# Patient Record
Sex: Female | Born: 1935 | Hispanic: Yes | Smoking: Never smoker
Health system: Southern US, Community
[De-identification: ages and names within clinical notes are randomized; demographics above are authoritative.]

## PROBLEM LIST (undated history)

## (undated) DIAGNOSIS — F419 Anxiety disorder, unspecified: Secondary | ICD-10-CM

## (undated) DIAGNOSIS — H409 Unspecified glaucoma: Secondary | ICD-10-CM

## (undated) DIAGNOSIS — I5022 Chronic systolic (congestive) heart failure: Secondary | ICD-10-CM

## (undated) DIAGNOSIS — I10 Essential (primary) hypertension: Secondary | ICD-10-CM

## (undated) DIAGNOSIS — Z9289 Personal history of other medical treatment: Secondary | ICD-10-CM

---

## 2020-08-09 ENCOUNTER — Other Ambulatory Visit: Payer: Self-pay

## 2020-08-09 ENCOUNTER — Ambulatory Visit
Admission: EM | Admit: 2020-08-09 | Discharge: 2020-08-09 | Disposition: A | Discharge status: Home / Self Care | Payer: Medicare Other | Attending: Family Medicine | Admitting: Family Medicine

## 2020-08-09 ENCOUNTER — Ambulatory Visit: Payer: Self-pay

## 2020-08-09 ENCOUNTER — Ambulatory Visit (INDEPENDENT_AMBULATORY_CARE_PROVIDER_SITE_OTHER): Payer: Medicare Other

## 2020-08-09 DIAGNOSIS — K449 Diaphragmatic hernia without obstruction or gangrene: Secondary | ICD-10-CM | POA: Diagnosis not present

## 2020-08-09 DIAGNOSIS — R059 Cough, unspecified: Secondary | ICD-10-CM | POA: Diagnosis not present

## 2020-08-09 MED ORDER — BENZONATATE 200 MG PO CAPS: 200.0000 mg | ORAL_CAPSULE | Freq: Three times a day (TID) | ORAL | 0 refills | Status: DC | PRN

## 2020-08-09 MED ORDER — PREDNISONE 20 MG PO TABS: 40.0000 mg | ORAL_TABLET | Freq: Every day | ORAL | 0 refills | Status: AC

## 2020-08-09 NOTE — ED Provider Notes (Signed)
[facility]    CSN: [id number] Arrival date & time: 08/09/20  1144      History   Chief Complaint Chief Complaint  Patient presents with  . Cough   HPI  87 year old female presents with cough.  3-week history of cough.  It is productive.  Denies shortness of breath.  No PND orthopnea.  She has a history of heart failure.  No documented fever.  Patient is concerned given the lack of improvement.  No reported sick contacts.  No other complaints.  PMH: CHF, systolic  Home Medications    Prior to Admission medications   Medication Sig Start Date End Date Taking? Authorizing Provider  benzonatate (TESSALON) 200 MG capsule Take 1 capsule (200 mg total) by mouth 3 (three) times daily as needed for cough. 08/09/20  Yes Mohid Furuya G, DO  carvedilol (COREG) 6.25 MG tablet Take 6.25 mg by mouth 2 (two) times daily with a meal.   Yes [provider]  furosemide (LASIX) 20 MG tablet Take 20 mg by mouth.   Yes [provider]  predniSONE (DELTASONE) 20 MG tablet Take 2 tablets (40 mg total) by mouth daily for 5 days. 08/09/20 08/14/20 Yes Royce Stegman G, DO  sacubitril-valsartan (ENTRESTO) 49-51 MG Take 1 tablet by mouth 2 (two) times daily.   Yes [provider]   Social History Social History   Tobacco Use  . Smoking status: Never Smoker  . Smokeless tobacco: Never Used  Vaping Use  . Vaping Use: Never used  Substance Use Topics  . Alcohol use: Yes    Comment: rare  . Drug use: Never   Allergies   Aspirin, Iodine, Mercury, and Tylenol [acetaminophen]   Review of Systems Review of Systems  Constitutional: Negative for fever.  Respiratory: Positive for cough.    Physical Exam Triage Vital Signs ED Triage Vitals  Enc Vitals Group     BP 08/09/20 1213 (!) 186/102     Pulse Rate 08/09/20 1213 70     Resp 08/09/20 1213 18     Temp 08/09/20 1213 97.9 F (36.6 C)     Temp Source 08/09/20 1213 Oral     SpO2 08/09/20 1213 95 %     Weight  08/09/20 1208 188 lb (85.3 kg)     Height 08/09/20 1208 5\' 4"  (1.626 m)     Head Circumference --      Peak Flow --      Pain Score 08/09/20 1208 0     Pain Loc --      Pain Edu? --      Excl. in GC? --    Updated Vital Signs BP (!) 186/102 (BP Location: Left Arm)   Pulse 70   Temp 97.9 F (36.6 C) (Oral)   Resp 18   Ht 5\' 4"  (1.626 m)   Wt 85.3 kg   SpO2 95%   BMI 32.27 kg/m   Visual Acuity Right Eye Distance:   Left Eye Distance:   Bilateral Distance:    Right Eye Near:   Left Eye Near:    Bilateral Near:     Physical Exam Vitals and nursing note reviewed.  Constitutional:      General: She is not in acute distress.    Appearance: Normal appearance. She is not ill-appearing.  HENT:     Head: Normocephalic and atraumatic.  Eyes:     General:        Right eye: No discharge.  Left eye: No discharge.     Conjunctiva/sclera: Conjunctivae normal.  Cardiovascular:     Rate and Rhythm: Normal rate and regular rhythm.  Pulmonary:     Effort: Pulmonary effort is normal.     Breath sounds: Wheezing present. No rales.  Neurological:     Mental Status: She is alert.  Psychiatric:        Mood and Affect: Mood normal.        Behavior: Behavior normal.    UC Treatments / Results  Labs (all labs ordered are listed, but only abnormal results are displayed) Labs Reviewed - No data to display  EKG   Radiology DG Chest 2 View  Result Date: 08/09/2020 CLINICAL DATA:  Cough EXAM: CHEST - 2 VIEW COMPARISON:  None. FINDINGS: Cardiac shadow is enlarged. Large hiatal hernia is noted. Scattered calcified lymph nodes are noted consistent with prior granulomatous disease. Mild blunting of the right costophrenic angle is noted laterally. No sizable effusion is seen posteriorly. This may be related to chronic scarring. No other focal infiltrate is noted. IMPRESSION: Large hiatal hernia. Changes of prior granulomatous disease. Blunting of the right costophrenic angle laterally  without posterior effusion. This may be related to underlying scarring. Electronically Signed   By: Alcide Clever M.D.   On: 08/09/2020 13:15    Procedures Procedures (including critical care time)  Medications Ordered in UC Medications - No data to display  Initial Impression / Assessment and Plan / UC Course  I have reviewed the triage vital signs and the nursing notes.  Pertinent labs & imaging results that were available during my care of the patient were reviewed by me and considered in my medical decision making (see chart for details).    87 year old female presents with cough.  Chest x-ray was obtained.  Patient has several areas of calcified lymph nodes consistent with prior granulomatous disease.  Scarring of the right base.  No acute findings.  Given wheezing on exam, I am placing her on prednisone.  Tessalon Perles for cough.  Final Clinical Impressions(s) / UC Diagnoses   Final diagnoses:  Cough     Discharge Instructions     Medication as prescribed.  If you worsen, please let us know.  Take care  Dr. Adriana Simas    ED Prescriptions    Medication Sig Dispense Auth. Provider   predniSONE (DELTASONE) 20 MG tablet Take 2 tablets (40 mg total) by mouth daily for 5 days. 10 tablet Johnnay Pleitez G, DO   benzonatate (TESSALON) 200 MG capsule Take 1 capsule (200 mg total) by mouth 3 (three) times daily as needed for cough. 30 capsule Tommie Sams, DO     PDMP not reviewed this encounter.   Tommie Sams, [place] 08/09/20 [phone]

## 2020-08-09 NOTE — ED Triage Notes (Signed)
Patient states that she has been having a cough, nasal congestion x 3 weeks. States that this started after being around a cat that she is allergic to. Patient states that her cough is very productive.

## 2020-08-09 NOTE — Discharge Instructions (Signed)
Medication as prescribed.  If you worsen, please let us know.  Take care  Dr. Kiyon Fidalgo  

## 2021-03-23 ENCOUNTER — Ambulatory Visit
Admission: RE | Admit: 2021-03-23 | Discharge: 2021-03-23 | Disposition: A | Discharge status: Home / Self Care | Payer: Medicare Other | Source: Ambulatory Visit | Attending: Physician Assistant | Admitting: Physician Assistant

## 2021-03-23 ENCOUNTER — Other Ambulatory Visit: Payer: Self-pay

## 2021-03-23 VITALS — BP 160/90 | HR 81 | Temp 98.0°F | Resp 18 | Ht 64.0 in | Wt 192.0 lb

## 2021-03-23 DIAGNOSIS — I10 Essential (primary) hypertension: Secondary | ICD-10-CM | POA: Diagnosis not present

## 2021-03-23 DIAGNOSIS — Z20822 Contact with and (suspected) exposure to covid-19: Secondary | ICD-10-CM | POA: Diagnosis not present

## 2021-03-23 DIAGNOSIS — R051 Acute cough: Secondary | ICD-10-CM | POA: Diagnosis present

## 2021-03-23 DIAGNOSIS — Z886 Allergy status to analgesic agent status: Secondary | ICD-10-CM | POA: Insufficient documentation

## 2021-03-23 DIAGNOSIS — B349 Viral infection, unspecified: Secondary | ICD-10-CM | POA: Diagnosis not present

## 2021-03-23 DIAGNOSIS — Z7901 Long term (current) use of anticoagulants: Secondary | ICD-10-CM | POA: Insufficient documentation

## 2021-03-23 DIAGNOSIS — Z888 Allergy status to other drugs, medicaments and biological substances status: Secondary | ICD-10-CM | POA: Diagnosis not present

## 2021-03-23 DIAGNOSIS — R0981 Nasal congestion: Secondary | ICD-10-CM | POA: Insufficient documentation

## 2021-03-23 NOTE — ED Provider Notes (Signed)
[facility]    CSN: [id number] Arrival date & time: 03/23/21  1250      History   Chief Complaint Chief Complaint  Patient presents with   Cough    HPI Diana Garrett is a 87 y.o. female presenting for 5-day history of cough that is occasionally productive.  Cough is worse when she lays down at night.  She did have a scratchy throat and nasal congestion at onset but those symptoms have resolved.  She has not had any fevers, chest pain or breathing difficulty.  Her daughter is ill with similar symptoms.  No known COVID exposure.  Has been taking over-the-counter decongestants without improvement in symptoms.  States they raise her blood pressure.  History of hypertension and takes Coreg.  No history of asthma or COPD.  No other complaints.  HPI  History reviewed. No pertinent past medical history.  There are no problems to display for this patient.   Past Surgical History:  Procedure Laterality Date   CESAREAN SECTION      OB History   No obstetric history on file.      Home Medications    Prior to Admission medications   Medication Sig Start Date End Date Taking? Authorizing Provider  carvedilol (COREG) 6.25 MG tablet Take 6.25 mg by mouth 2 (two) times daily with a meal.   Yes [provider]  furosemide (LASIX) 20 MG tablet Take 20 mg by mouth.   Yes [provider]  sacubitril-valsartan (ENTRESTO) 49-51 MG Take 1 tablet by mouth 2 (two) times daily.   Yes [provider]    Family History History reviewed. No pertinent family history.  Social History Social History   Tobacco Use   Smoking status: Never   Smokeless tobacco: Never  Vaping Use   Vaping Use: Never used  Substance Use Topics   Alcohol use: Yes    Comment: rare   Drug use: Never     Allergies   Aspirin, Iodine, Mercury, and Tylenol [acetaminophen]   Review of Systems Review of Systems  Constitutional:  Negative for chills, diaphoresis, fatigue  and fever.  HENT:  Positive for congestion. Negative for ear pain, rhinorrhea, sinus pressure, sinus pain and sore throat.   Respiratory:  Positive for cough. Negative for shortness of breath.   Cardiovascular:  Negative for chest pain.  Gastrointestinal:  Negative for abdominal pain, nausea and vomiting.  Musculoskeletal:  Negative for arthralgias and myalgias.  Skin:  Negative for rash.  Neurological:  Negative for weakness and headaches.  Hematological:  Negative for adenopathy.    Physical Exam Triage Vital Signs ED Triage Vitals  Enc Vitals Group     BP 03/23/21 1333 (!) 160/90     Pulse Rate 03/23/21 1333 81     Resp 03/23/21 1333 18     Temp 03/23/21 1333 98 F (36.7 C)     Temp Source 03/23/21 1333 Oral     SpO2 03/23/21 1333 97 %     Weight 03/23/21 1331 192 lb (87.1 kg)     Height 03/23/21 1331 5\' 4"  (1.626 m)     Head Circumference --      Peak Flow --      Pain Score --      Pain Loc --      Pain Edu? --      Excl. in GC? --    No data found.  Updated Vital Signs BP (!) 160/90 (BP Location: Left Arm) Comment: Pt  gets white coat  Pulse 81   Temp 98 F (36.7 C) (Oral)   Resp 18   Ht 5\' 4"  (1.626 m)   Wt 192 lb (87.1 kg)   SpO2 97%   BMI 32.96 kg/m      Physical Exam Vitals and nursing note reviewed.  Constitutional:      General: She is not in acute distress.    Appearance: Normal appearance. She is not ill-appearing or toxic-appearing.  HENT:     Head: Normocephalic and atraumatic.     Nose: Congestion present.     Mouth/Throat:     Mouth: Mucous membranes are moist.     Pharynx: Oropharynx is clear.  Eyes:     General: No scleral icterus.       Right eye: No discharge.        Left eye: No discharge.     Conjunctiva/sclera: Conjunctivae normal.  Cardiovascular:     Rate and Rhythm: Normal rate and regular rhythm.     Heart sounds: Normal heart sounds.  Pulmonary:     Effort: Pulmonary effort is normal. No respiratory distress.     Breath  sounds: Normal breath sounds.  Musculoskeletal:     Cervical back: Neck supple.  Skin:    General: Skin is dry.  Neurological:     General: No focal deficit present.     Mental Status: She is alert. Mental status is at baseline.     Motor: No weakness.     Gait: Gait normal.  Psychiatric:        Mood and Affect: Mood normal.        Behavior: Behavior normal.        Thought Content: Thought content normal.     UC Treatments / Results  Labs (all labs ordered are listed, but only abnormal results are displayed) Labs Reviewed  SARS CORONAVIRUS 2 (TAT 6-24 HRS)    EKG   Radiology No results found.  Procedures Procedures (including critical care time)  Medications Ordered in UC Medications - No data to display  Initial Impression / Assessment and Plan / UC Course  I have reviewed the triage vital signs and the nursing notes.  Pertinent labs & imaging results that were available during my care of the patient were reviewed by me and considered in my medical decision making (see chart for details).  87 year old female presenting for cough x5 days.  Cough occasionally is productive.  Nasal congestion and sore throat have resolved.  No fever or breathing difficulty.  Blood pressure elevated 160/90.  She is afebrile and oxygen is normal.  Exam reveals minor nasal congestion.  Chest clear to auscultation heart regular rate and rhythm.  PCR COVID test obtained.  Current CDC guidelines, isolation protocol and ED precautions reviewed.  Advised symptoms consistent with viral illness.  Supportive care encouraged with increasing rest and fluids.  Advised over-the-counter Coricidin HBP and to follow-up for fever, chest pain, worsening cough or breathing issue.  Final Clinical Impressions(s) / UC Diagnoses   Final diagnoses:  Viral illness  Acute cough     Discharge Instructions      -The cough is likely due to a virus.  We will run its course over 7 to 10 days.  If you develop a  fever, wheezing or breathing difficulty, return centimeters.   -Purchase and start over-the-counter Coricidin HBP for cough.  This will not raise your blood pressure. -COVID test will be back tomorrow.  If you are COVID-positive  EF to be isolated 5 days from when the symptoms started and then wear a mask for 5 days.     ED Prescriptions   None    PDMP not reviewed this encounter.   Shirlee Latch, PA-C 03/23/21 1440

## 2021-03-23 NOTE — ED Triage Notes (Signed)
Pt here with C/O cough for 5 days, worst at night when she lays down, has tried cough drops and some OTC cough medication without relief.

## 2021-03-23 NOTE — Discharge Instructions (Addendum)
-  The cough is likely due to a virus.  We will run its course over 7 to 10 days.  If you develop a fever, wheezing or breathing difficulty, return centimeters.   -Purchase and start over-the-counter Coricidin HBP for cough.  This will not raise your blood pressure. -COVID test will be back tomorrow.  If you are COVID-positive EF to be isolated 5 days from when the symptoms started and then wear a mask for 5 days.

## 2021-03-24 LAB — SARS CORONAVIRUS 2 (TAT 6-24 HRS): SARS Coronavirus 2: NEGATIVE

## 2021-06-07 ENCOUNTER — Other Ambulatory Visit: Payer: Self-pay

## 2021-06-07 ENCOUNTER — Ambulatory Visit (INDEPENDENT_AMBULATORY_CARE_PROVIDER_SITE_OTHER): Payer: Medicare Other

## 2021-06-07 ENCOUNTER — Ambulatory Visit
Admission: EM | Admit: 2021-06-07 | Discharge: 2021-06-07 | Disposition: A | Discharge status: Home / Self Care | Payer: Medicare Other | Attending: Emergency Medicine | Admitting: Emergency Medicine

## 2021-06-07 DIAGNOSIS — R059 Cough, unspecified: Secondary | ICD-10-CM

## 2021-06-07 DIAGNOSIS — J189 Pneumonia, unspecified organism: Secondary | ICD-10-CM

## 2021-06-07 MED ORDER — AEROCHAMBER MV MISC: 2 refills | Status: DC

## 2021-06-07 MED ORDER — AZITHROMYCIN 250 MG PO TABS: 250.0000 mg | ORAL_TABLET | Freq: Every day | ORAL | 0 refills | Status: DC

## 2021-06-07 MED ORDER — ALBUTEROL SULFATE HFA 108 (90 BASE) MCG/ACT IN AERS: 2.0000 | INHALATION_SPRAY | RESPIRATORY_TRACT | 0 refills | Status: AC | PRN

## 2021-06-07 MED ORDER — AMOXICILLIN-POT CLAVULANATE 875-125 MG PO TABS: 1.0000 | ORAL_TABLET | Freq: Two times a day (BID) | ORAL | 0 refills | Status: DC

## 2021-06-07 MED ORDER — PREDNISONE 50 MG PO TABS: ORAL_TABLET | ORAL | 0 refills | Status: DC

## 2021-06-07 NOTE — Discharge Instructions (Addendum)
Take the Augmentin twice daily with food for 7 days for treatment of your pneumonia.  Take the azithromycin as directed.  You will take 2 tablets on day 1 and then 1 tablet each day after that for total of 5 days for treatment of your pneumonia.  Started in the morning take the prednisone 50 mg tablet each morning with breakfast for 5 days to decrease lung inflammation.  Use the albuterol inhaler with a spacer, 2 puffs every 4-6 hours, as needed for shortness of breath or wheezing.  Use the Tessalon Perles every 8 hours during the day as needed for cough.  Taken with a small sip of water.  These may give you numbness to the base of your tongue or metallic taste in mouth, this is normal  Return for reevaluation if you have any new or worsening symptoms.  Follow-up with your primary care provider in 4 to 6 weeks for a repeat chest x-ray to ensure resolution of your pneumonia.

## 2021-06-07 NOTE — ED Triage Notes (Signed)
Pt presents with complaints of cough x 2 months. Reports ongoing feeling sick x 1 weak. Today patient feels like her blood pressure is low.

## 2021-06-07 NOTE — ED Provider Notes (Signed)
[facility]    CSN: [id number] Arrival date & time: 06/07/21  1757      History   Chief Complaint Chief Complaint  Patient presents with   Cough    HPI Diana Garrett is a 87 y.o. female.   HPI  87 year old female here for evaluation of respiratory complaints.  Patient is here with her son for evaluation of a cough that is been present for last 2 months.  The cough she reports is productive for a white sputum and she also endorses intermittent wheezing.  She has not had any fever, runny nose nasal congestion, sore throat, or GI complaints.  Patient does report that she has felt sick for the past week.  She thought initially that her blood pressure might be low.  History reviewed. No pertinent past medical history.  There are no problems to display for this patient.   Past Surgical History:  Procedure Laterality Date   CESAREAN SECTION      OB History   No obstetric history on file.      Home Medications    Prior to Admission medications   Medication Sig Start Date End Date Taking? Authorizing Provider  albuterol (VENTOLIN HFA) 108 (90 Base) MCG/ACT inhaler Inhale 2 puffs into the lungs every 4 (four) hours as needed. 06/07/21  Yes Becky Augusta, NP  amoxicillin-clavulanate (AUGMENTIN) 875-125 MG tablet Take 1 tablet by mouth every 12 (twelve) hours. 06/07/21  Yes Becky Augusta, NP  azithromycin (ZITHROMAX Z-PAK) 250 MG tablet Take 1 tablet (250 mg total) by mouth daily. Take 2 tablets on the first day and then 1 tablet daily thereafter for a total of 5 days of treatment. 06/07/21  Yes Becky Augusta, NP  predniSONE (DELTASONE) 50 MG tablet Take 1 tablet daily by mouth for 5 days. 06/07/21  Yes Becky Augusta, NP  Spacer/Aero-Holding Deretha Emory (AEROCHAMBER MV) inhaler Use as instructed 06/07/21  Yes Becky Augusta, NP  carvedilol (COREG) 6.25 MG tablet Take 6.25 mg by mouth 2 (two) times daily with a meal.    [provider]  furosemide (LASIX) 20 MG  tablet Take 20 mg by mouth.    [provider]  sacubitril-valsartan (ENTRESTO) 49-51 MG Take 1 tablet by mouth 2 (two) times daily.    [provider]    Family History History reviewed. No pertinent family history.  Social History Social History   Tobacco Use   Smoking status: Never   Smokeless tobacco: Never  Vaping Use   Vaping Use: Never used  Substance Use Topics   Alcohol use: Yes    Comment: rare   Drug use: Never     Allergies   Aspirin, Iodine, Mercury, and Tylenol [acetaminophen]   Review of Systems Review of Systems  Constitutional:  Negative for activity change, appetite change and fever.  HENT:  Negative for congestion, ear pain, rhinorrhea and sore throat.   Respiratory:  Positive for cough and wheezing. Negative for shortness of breath.   Gastrointestinal:  Negative for diarrhea, nausea and vomiting.  Skin:  Negative for rash.  Hematological: Negative.   Psychiatric/Behavioral: Negative.      Physical Exam Triage Vital Signs ED Triage Vitals  Enc Vitals Group     BP 06/07/21 1807 (!) 166/100     Pulse Rate 06/07/21 1806 (!) 107     Resp 06/07/21 1806 19     Temp 06/07/21 1806 98.1 F (36.7 C)     Temp src --      SpO2  06/07/21 1806 96 %     Weight --      Height --      Head Circumference --      Peak Flow --      Pain Score 06/07/21 1806 0     Pain Loc --      Pain Edu? --      Excl. in GC? --    No data found.  Updated Vital Signs BP (!) 166/100    Pulse (!) 107    Temp 98.1 F (36.7 C)    Resp 19    SpO2 96%   Visual Acuity Right Eye Distance:   Left Eye Distance:   Bilateral Distance:    Right Eye Near:   Left Eye Near:    Bilateral Near:     Physical Exam Vitals and nursing note reviewed.  Constitutional:      General: She is not in acute distress.    Appearance: She is ill-appearing.  HENT:     Head: Normocephalic and atraumatic.     Right Ear: Tympanic membrane, ear canal and external ear normal.  There is no impacted cerumen.     Left Ear: Tympanic membrane, ear canal and external ear normal. There is no impacted cerumen.     Nose: Nose normal. No congestion or rhinorrhea.     Mouth/Throat:     Mouth: Mucous membranes are moist.     Pharynx: No posterior oropharyngeal erythema.  Cardiovascular:     Rate and Rhythm: Normal rate and regular rhythm.     Pulses: Normal pulses.     Heart sounds: Normal heart sounds. No murmur heard.   No gallop.  Pulmonary:     Effort: Pulmonary effort is normal.  Musculoskeletal:     Cervical back: Normal range of motion and neck supple.  Lymphadenopathy:     Cervical: No cervical adenopathy.  Skin:    General: Skin is warm and dry.     Capillary Refill: Capillary refill takes less than 2 seconds.     Findings: No erythema or rash.  Neurological:     General: No focal deficit present.     Mental Status: She is alert and oriented to person, place, and time.  Psychiatric:        Mood and Affect: Mood normal.        Behavior: Behavior normal.        Thought Content: Thought content normal.        Judgment: Judgment normal.     UC Treatments / Results  Labs (all labs ordered are listed, but only abnormal results are displayed) Labs Reviewed - No data to display  EKG   Radiology DG Chest 2 View  Result Date: 06/07/2021 CLINICAL DATA:  Cough for 2 months EXAM: CHEST - 2 VIEW COMPARISON:  08/09/2020 FINDINGS: Frontal and lateral views of the chest are obtained. Patient is rotated to the right on the frontal projection. A large hiatal hernia is again identified. Cardiac silhouette is stable. There is patchy consolidation in the right lung base, which could reflect airspace disease or compressive atelectasis. Blunting of the right lateral costophrenic angle unchanged, consistent with pleural thickening. No evidence of effusion or pneumothorax. IMPRESSION: 1. Patchy right basilar consolidation, which may reflect atelectasis or developing  pneumonia. 2. Large hiatal hernia. 3. Stable pleural thickening at the right costophrenic angle. Electronically Signed   By: Sharlet Salina M.D.   On: 06/07/2021 18:34    Procedures Procedures (including critical  care time)  Medications Ordered in UC Medications - No data to display  Initial Impression / Assessment and Plan / UC Course  I have reviewed the triage vital signs and the nursing notes.  Pertinent labs & imaging results that were available during my care of the patient were reviewed by me and considered in my medical decision making (see chart for details).  Patient is a ill-appearing 87 year old female here for evaluation of a cough and wheezing that been present for the last 2 months.  Patient reports that the cough is productive for a white sputum and that she also has some intermittent wheezing.  She has no history of COPD, asthma, and denies any history of smoking in the past.  She states that she has felt sick for the past week and thought her blood pressure might be low.  Her blood pressure is 166/100.  Patient states she has not been whitecoat syndrome and when reviewing the historical vital signs trends patient is always had elevated blood pressure.  Patient denies any additional upper respiratory symptoms.  Physical exam reveals pearly gray tympanic membranes bilaterally with normal light reflex and clear external auditory canals.  Nasal mucosa is pink and moist without erythema, edema, or discharge.  Oropharyngeal exam is benign.  No cervical lymphadenopathy appreciated exam.  Cardiopulmonary exam reveals diffusely decreased lung sounds in all lung fields.  Patient is not tachypneic or or dyspneic.  She is able to speak in full sentences and answer all questions appropriately.  Will obtain chest x-ray to look for acute cardiopulmonary process.  Chest x-ray independently reviewed and evaluated by me.  Impression: There is an apparent patchy hilar infiltrate on the right and an  additional fluid collection in the right base as there is a blunting of the costophrenic angle.  Radiology overread is pending. Radiology impression is patchy right basilar consolidation which may reflect atelectasis versus developing pneumonia.  Large hiatal hernia.  Stable pleural thickening at the right costophrenic angle.  Given patient's 15-month history of a productive cough we will treat for presumptive infectious process with dual therapy of Augmentin and azithromycin.  We will also place patient on prednisone and provide Tessalon Perles and albuterol inhaler to help with cough and wheezing.   Final Clinical Impressions(s) / UC Diagnoses   Final diagnoses:  Community acquired pneumonia of right lower lobe of lung     Discharge Instructions      Take the Augmentin twice daily with food for 7 days for treatment of your pneumonia.  Take the azithromycin as directed.  You will take 2 tablets on day 1 and then 1 tablet each day after that for total of 5 days for treatment of your pneumonia.  Started in the morning take the prednisone 50 mg tablet each morning with breakfast for 5 days to decrease lung inflammation.  Use the albuterol inhaler with a spacer, 2 puffs every 4-6 hours, as needed for shortness of breath or wheezing.  Use the Tessalon Perles every 8 hours during the day as needed for cough.  Taken with a small sip of water.  These may give you numbness to the base of your tongue or metallic taste in mouth, this is normal  Return for reevaluation if you have any new or worsening symptoms.  Follow-up with your primary care provider in 4 to 6 weeks for a repeat chest x-ray to ensure resolution of your pneumonia.      ED Prescriptions     Medication Sig  Dispense Auth. Provider   amoxicillin-clavulanate (AUGMENTIN) 875-125 MG tablet Take 1 tablet by mouth every 12 (twelve) hours. 14 tablet Becky Augusta, NP   azithromycin (ZITHROMAX Z-PAK) 250 MG tablet Take 1 tablet (250  mg total) by mouth daily. Take 2 tablets on the first day and then 1 tablet daily thereafter for a total of 5 days of treatment. 6 tablet Becky Augusta, NP   predniSONE (DELTASONE) 50 MG tablet Take 1 tablet daily by mouth for 5 days. 5 tablet Becky Augusta, NP   albuterol (VENTOLIN HFA) 108 (90 Base) MCG/ACT inhaler Inhale 2 puffs into the lungs every 4 (four) hours as needed. 18 g Becky Augusta, NP   Spacer/Aero-Holding Chambers (AEROCHAMBER MV) inhaler Use as instructed 1 each Becky Augusta, NP      PDMP not reviewed this encounter.   Becky Augusta, NP 06/07/21 [phone]

## 2021-08-14 ENCOUNTER — Other Ambulatory Visit: Payer: Self-pay

## 2021-08-14 ENCOUNTER — Inpatient Hospital Stay
Admission: EM | Admit: 2021-08-14 | Discharge: 2021-08-16 | DRG: 291 | Disposition: A | Discharge status: Home Health | Payer: Medicare Other | Attending: Internal Medicine | Admitting: Internal Medicine

## 2021-08-14 ENCOUNTER — Emergency Department: Payer: Medicare Other

## 2021-08-14 ENCOUNTER — Inpatient Hospital Stay
Admit: 2021-08-14 | Discharge: 2021-08-14 | Disposition: A | Discharge status: Home / Self Care | Payer: Medicare Other | Attending: Internal Medicine | Admitting: Internal Medicine

## 2021-08-14 DIAGNOSIS — I5023 Acute on chronic systolic (congestive) heart failure: Secondary | ICD-10-CM | POA: Insufficient documentation

## 2021-08-14 DIAGNOSIS — I509 Heart failure, unspecified: Secondary | ICD-10-CM

## 2021-08-14 DIAGNOSIS — R0602 Shortness of breath: Secondary | ICD-10-CM | POA: Diagnosis present

## 2021-08-14 DIAGNOSIS — I10 Essential (primary) hypertension: Secondary | ICD-10-CM | POA: Diagnosis not present

## 2021-08-14 DIAGNOSIS — J40 Bronchitis, not specified as acute or chronic: Secondary | ICD-10-CM | POA: Diagnosis present

## 2021-08-14 DIAGNOSIS — Z79899 Other long term (current) drug therapy: Secondary | ICD-10-CM

## 2021-08-14 DIAGNOSIS — D72829 Elevated white blood cell count, unspecified: Secondary | ICD-10-CM | POA: Diagnosis not present

## 2021-08-14 DIAGNOSIS — F411 Generalized anxiety disorder: Secondary | ICD-10-CM | POA: Diagnosis present

## 2021-08-14 DIAGNOSIS — I5042 Chronic combined systolic (congestive) and diastolic (congestive) heart failure: Secondary | ICD-10-CM | POA: Diagnosis not present

## 2021-08-14 DIAGNOSIS — E876 Hypokalemia: Secondary | ICD-10-CM | POA: Diagnosis present

## 2021-08-14 DIAGNOSIS — I420 Dilated cardiomyopathy: Secondary | ICD-10-CM | POA: Diagnosis present

## 2021-08-14 DIAGNOSIS — J208 Acute bronchitis due to other specified organisms: Secondary | ICD-10-CM | POA: Diagnosis not present

## 2021-08-14 DIAGNOSIS — Z20822 Contact with and (suspected) exposure to covid-19: Secondary | ICD-10-CM | POA: Diagnosis present

## 2021-08-14 DIAGNOSIS — J9601 Acute respiratory failure with hypoxia: Secondary | ICD-10-CM | POA: Diagnosis present

## 2021-08-14 DIAGNOSIS — Z7952 Long term (current) use of systemic steroids: Secondary | ICD-10-CM

## 2021-08-14 DIAGNOSIS — I110 Hypertensive heart disease with heart failure: Secondary | ICD-10-CM | POA: Diagnosis not present

## 2021-08-14 DIAGNOSIS — I5043 Acute on chronic combined systolic (congestive) and diastolic (congestive) heart failure: Secondary | ICD-10-CM | POA: Diagnosis present

## 2021-08-14 DIAGNOSIS — Z9114 Patient's other noncompliance with medication regimen: Secondary | ICD-10-CM

## 2021-08-14 DIAGNOSIS — Z91119 Patient's noncompliance with dietary regimen due to unspecified reason: Secondary | ICD-10-CM

## 2021-08-14 DIAGNOSIS — D72828 Other elevated white blood cell count: Secondary | ICD-10-CM | POA: Diagnosis present

## 2021-08-14 DIAGNOSIS — Z6831 Body mass index (BMI) 31.0-31.9, adult: Secondary | ICD-10-CM | POA: Diagnosis not present

## 2021-08-14 DIAGNOSIS — Z8249 Family history of ischemic heart disease and other diseases of the circulatory system: Secondary | ICD-10-CM | POA: Diagnosis not present

## 2021-08-14 DIAGNOSIS — R0902 Hypoxemia: Secondary | ICD-10-CM

## 2021-08-14 DIAGNOSIS — I5022 Chronic systolic (congestive) heart failure: Secondary | ICD-10-CM | POA: Insufficient documentation

## 2021-08-14 HISTORY — DX: Anxiety disorder, unspecified: F41.9

## 2021-08-14 HISTORY — DX: Morbid (severe) obesity due to excess calories: E66.01

## 2021-08-14 HISTORY — DX: Essential (primary) hypertension: I10

## 2021-08-14 HISTORY — DX: Personal history of other medical treatment: Z92.89

## 2021-08-14 HISTORY — DX: Chronic systolic (congestive) heart failure: I50.22

## 2021-08-14 HISTORY — DX: Unspecified glaucoma: H40.9

## 2021-08-14 LAB — CBC
HCT: 45.8 % (ref 36.0–46.0)
Hemoglobin: 14.1 g/dL (ref 12.0–15.0)
MCH: 29.8 pg (ref 26.0–34.0)
MCHC: 30.8 g/dL (ref 30.0–36.0)
MCV: 96.8 fL (ref 80.0–100.0)
Platelets: 207 10*3/uL (ref 150–400)
RBC: 4.73 MIL/uL (ref 3.87–5.11)
RDW: 13.3 % (ref 11.5–15.5)
WBC: 13.9 10*3/uL — ABNORMAL HIGH (ref 4.0–10.5)
nRBC: 0 % (ref 0.0–0.2)

## 2021-08-14 LAB — COMPREHENSIVE METABOLIC PANEL
ALT: 10 U/L (ref 0–44)
AST: 31 U/L (ref 15–41)
Albumin: 3.7 g/dL (ref 3.5–5.0)
Alkaline Phosphatase: 96 U/L (ref 38–126)
Anion gap: 10 (ref 5–15)
BUN: 19 mg/dL (ref 8–23)
CO2: 25 mmol/L (ref 22–32)
Calcium: 9 mg/dL (ref 8.9–10.3)
Chloride: 106 mmol/L (ref 98–111)
Creatinine, Ser: 0.55 mg/dL (ref 0.44–1.00)
GFR, Estimated: >60 mL/min (ref >60)
Glucose, Bld: 169 mg/dL — ABNORMAL HIGH (ref 70–99)
Potassium: 3.9 mmol/L (ref 3.5–5.1)
Sodium: 141 mmol/L (ref 135–145)
Total Bilirubin: 0.8 mg/dL (ref 0.3–1.2)
Total Protein: 6.9 g/dL (ref 6.5–8.1)

## 2021-08-14 LAB — ECHOCARDIOGRAM COMPLETE
AR max vel: 1.49 cm2
AV Area VTI: 1.63 cm2
AV Area mean vel: 1.39 cm2
AV Mean grad: 5 mmHg
AV Peak grad: 8.5 mmHg
Ao pk vel: 1.46 m/s
Area-P 1/2: 5.09 cm2
Calc EF: 36.7 %
MV VTI: 2.42 cm2
Single Plane A2C EF: 37.5 %
Single Plane A4C EF: 33.8 %
Weight: 3200 oz

## 2021-08-14 LAB — PROCALCITONIN: Procalcitonin: <0.1 ng/mL

## 2021-08-14 LAB — RESP PANEL BY RT-PCR (FLU A&B, COVID) ARPGX2
Influenza A by PCR: NEGATIVE
Influenza B by PCR: NEGATIVE
SARS Coronavirus 2 by RT PCR: NEGATIVE

## 2021-08-14 LAB — BRAIN NATRIURETIC PEPTIDE: B Natriuretic Peptide: 326.8 pg/mL — ABNORMAL HIGH (ref 0.0–100.0)

## 2021-08-14 LAB — TROPONIN I (HIGH SENSITIVITY): Troponin I (High Sensitivity): 10 ng/L (ref <18)

## 2021-08-14 MED ORDER — FUROSEMIDE 10 MG/ML IJ SOLN
60.0000 mg | Freq: Once | INTRAMUSCULAR | Status: AC
  Administered 2021-08-14: 60 mg via INTRAVENOUS
  Filled 2021-08-14: qty 8

## 2021-08-14 MED ORDER — ENOXAPARIN SODIUM 40 MG/0.4ML IJ SOSY
40.0000 mg | PREFILLED_SYRINGE | INTRAMUSCULAR | Status: DC
  Administered 2021-08-14 – 2021-08-15 (×2): 40 mg via SUBCUTANEOUS
  Filled 2021-08-14 (×2): qty 0.4

## 2021-08-14 MED ORDER — ALBUTEROL SULFATE (2.5 MG/3ML) 0.083% IN NEBU
2.5000 mg | INHALATION_SOLUTION | RESPIRATORY_TRACT | Status: DC | PRN
  Administered 2021-08-14 – 2021-08-16 (×5): 2.5 mg via RESPIRATORY_TRACT
  Filled 2021-08-14 (×5): qty 3

## 2021-08-14 MED ORDER — CARVEDILOL 6.25 MG PO TABS
6.2500 mg | ORAL_TABLET | Freq: Two times a day (BID) | ORAL | Status: DC
  Administered 2021-08-14 – 2021-08-16 (×4): 6.25 mg via ORAL
  Filled 2021-08-14 (×4): qty 1

## 2021-08-14 MED ORDER — ONDANSETRON HCL 4 MG/2ML IJ SOLN: 4.0000 mg | Freq: Three times a day (TID) | INTRAMUSCULAR | Status: DC | PRN

## 2021-08-14 MED ORDER — IPRATROPIUM-ALBUTEROL 0.5-2.5 (3) MG/3ML IN SOLN
3.0000 mL | Freq: Once | RESPIRATORY_TRACT | Status: AC
  Administered 2021-08-14: 3 mL via RESPIRATORY_TRACT
  Filled 2021-08-14: qty 3

## 2021-08-14 MED ORDER — LATANOPROST 0.005 % OP SOLN
1.0000 [drp] | Freq: Every day | OPHTHALMIC | Status: DC
  Administered 2021-08-14 – 2021-08-15 (×2): 1 [drp] via OPHTHALMIC
  Filled 2021-08-14 (×2): qty 2.5

## 2021-08-14 MED ORDER — HYDRALAZINE HCL 20 MG/ML IJ SOLN: 5.0000 mg | INTRAMUSCULAR | Status: DC | PRN

## 2021-08-14 MED ORDER — DM-GUAIFENESIN ER 30-600 MG PO TB12
1.0000 | ORAL_TABLET | Freq: Two times a day (BID) | ORAL | Status: DC | PRN
  Administered 2021-08-14: 1 via ORAL
  Filled 2021-08-14 (×2): qty 1

## 2021-08-14 MED ORDER — SACUBITRIL-VALSARTAN 49-51 MG PO TABS
1.0000 | ORAL_TABLET | Freq: Two times a day (BID) | ORAL | Status: DC
  Administered 2021-08-14 – 2021-08-16 (×5): 1 via ORAL
  Filled 2021-08-14 (×6): qty 1

## 2021-08-14 MED ORDER — FUROSEMIDE 10 MG/ML IJ SOLN
40.0000 mg | Freq: Two times a day (BID) | INTRAMUSCULAR | Status: DC
  Administered 2021-08-14 – 2021-08-15 (×2): 40 mg via INTRAVENOUS
  Filled 2021-08-14 (×2): qty 4

## 2021-08-14 MED ORDER — IBUPROFEN 400 MG PO TABS
200.0000 mg | ORAL_TABLET | Freq: Four times a day (QID) | ORAL | Status: DC | PRN
Start: 2021-08-14 — End: 2021-08-16

## 2021-08-14 MED ORDER — TIMOLOL MALEATE 0.5 % OP SOLN
1.0000 [drp] | Freq: Every day | OPHTHALMIC | Status: DC
  Administered 2021-08-15 – 2021-08-16 (×2): 1 [drp] via OPHTHALMIC
  Filled 2021-08-14 (×2): qty 5

## 2021-08-14 NOTE — Assessment & Plan Note (Addendum)
Acute respiratory failure with hypoxia due to acute on chronic systolic CHF exacerbation: No 2D echo on record, but per her PCPs clinic note on 09/17/2020, patient had EF of 45-50% on April 2021.  Now patient has 1+ leg edema, positive JVD, crackles on auscultation, elevated BNP 326, vascular congestion and cardiomegaly on chest x-ray, clinically consistent with CHF exacerbation.  Patient has new 4 L oxygen requirement today. ? ? ?-Will admit to progressive unit as inpatient ?-Lasix 40 mg bid by IV (patient received 1 dose of IV Lasix 60 mg in ED) ?-Continue home Entresto ?-2d echo ?-Daily weights ?-strict I/O's ?-Low salt diet ?-Fluid restriction ?-Obtain REDs Vest reading ? ? ? ?

## 2021-08-14 NOTE — ED Notes (Signed)
Breakfast tray given to pt. Pt's oxygen decreased from 4L/min to 3L/min.  ?

## 2021-08-14 NOTE — ED Notes (Signed)
X RAY at bedside 

## 2021-08-14 NOTE — Assessment & Plan Note (Signed)
-   Coreg, Entresto ?-IV hydralazine as needed ?

## 2021-08-14 NOTE — Assessment & Plan Note (Signed)
WBC 13.9, no fever, no source of infection identified.  Possibly reactive. ?-Check procalcitonin level ?-Follow-up CBC ?

## 2021-08-14 NOTE — H&P (Signed)
History and Physical    Diana Garrett FBP:[id number] DOB: 04/25/1930 DOA: 08/14/2021  Referring MD/NP/PA:   PCP: Patient, No Pcp Per (Inactive)   Patient coming from:  The patient is coming from home.    Chief Complaint: SOB  HPI: Diana Garrett is a 88 y.o. female with medical history significant of sCHF with EF 45-50%), HTN, glaucoma, who presents with shortness of breath.  Per patient and her son at the bedside, patient started having shortness of breath since this morning, which has been progressively worsening.  Patient has cough with little clear mucus production.  Denies chest pain, fever or chills.  Patient was found to have oxygen desaturating to 80s on room air, 4L of oxygen is started in ED.  Patient is not wearing oxygen normally at home.  Patient denies nausea, vomiting, diarrhea or abdominal pain.  No symptoms of UTI.  Data Reviewed and ED Course: pt was found to have BNP 326, troponin level 10, negative COVID PCR, electrolytes renal function okay, temperature 97.5, blood pressure 156/88, heart rate 91, RR 28, oxygen saturation 92-93% on 4 L oxygen.  Chest x-ray showed cardiomegaly and vascular congestion.  Patient is admitted to progressive bed as inpatient.   EKG: I have personally reviewed.  Sinus rhythm, QTc 492, left atrial enlargement, poor R wave progression, T wave inversion in lead I/aVL.   Review of Systems:   General: no fevers, chills, no body weight gain, has fatigue HEENT: no blurry vision, hearing changes or sore throat Respiratory: has dyspnea, coughing, no wheezing CV: no chest pain, no palpitations GI: no nausea, vomiting, abdominal pain, diarrhea, constipation GU: no dysuria, burning on urination, increased urinary frequency, hematuria  Ext: has leg edema Neuro: no unilateral weakness, numbness, or tingling, no vision change or hearing loss Skin: no rash, no skin tear. MSK: No muscle spasm, no deformity, no limitation of range of movement in spin Heme:  No easy bruising.  Travel history: No recent long distant travel.   Allergy:  Allergies  Allergen Reactions   Aspirin    Iodine    Mercury    Tylenol [Acetaminophen]     Past Medical History:  Diagnosis Date   Chronic systolic CHF (congestive heart failure) (HCC)    Glaucoma    HTN (hypertension)     Past Surgical History:  Procedure Laterality Date   CESAREAN SECTION      Social History:  reports that she has never smoked. She has never used smokeless tobacco. She reports current alcohol use. She reports that she does not use drugs.  Family History:  Family History  Problem Relation Age of Onset   Hypertension Father    Heart disease Father      Prior to Admission medications   Medication Sig Start Date End Date Taking? Authorizing Provider  albuterol (VENTOLIN HFA) 108 (90 Base) MCG/ACT inhaler Inhale 2 puffs into the lungs every 4 (four) hours as needed. 06/07/21   Becky Augusta, NP  amoxicillin-clavulanate (AUGMENTIN) 875-125 MG tablet Take 1 tablet by mouth every 12 (twelve) hours. 06/07/21   Becky Augusta, NP  azithromycin (ZITHROMAX Z-PAK) 250 MG tablet Take 1 tablet (250 mg total) by mouth daily. Take 2 tablets on the first day and then 1 tablet daily thereafter for a total of 5 days of treatment. 06/07/21   Becky Augusta, NP  carvedilol (COREG) 6.25 MG tablet Take 6.25 mg by mouth 2 (two) times daily with a meal.    [provider]  furosemide (LASIX) 20  MG tablet Take 20 mg by mouth.    [provider]  predniSONE (DELTASONE) 50 MG tablet Take 1 tablet daily by mouth for 5 days. 06/07/21   Becky Augustayan, Jeremy, NP  sacubitril-valsartan (ENTRESTO) 49-51 MG Take 1 tablet by mouth 2 (two) times daily.    [provider]  Spacer/Aero-Holding Chambers (AEROCHAMBER MV) inhaler Use as instructed 06/07/21   Becky Augustayan, Jeremy, NP    Physical Exam: Vitals:   08/14/21 0658 08/14/21 0700 08/14/21 0730 08/14/21 0800  BP:  (!) 155/77 126/68 127/67  Pulse:  86  83 77  Resp:  (!) 26 (!) 25 (!) 22  Temp:      TempSrc:      SpO2:  93% 94% 99%  Weight: 90.7 kg      General: Not in acute distress HEENT:       Eyes: PERRL, EOMI, no scleral icterus.       ENT: No discharge from the ears and nose, no pharynx injection, no tonsillar enlargement.        Neck: positive JVD, no bruit, no mass felt. Heme: No neck lymph node enlargement. Cardiac: S1/S2, RRR, No murmurs, No gallops or rubs. Respiratory: Has crackles and rhonchi bilaterally GI: Soft, nondistended, nontender, no rebound pain, no organomegaly, BS present. GU: No hematuria Ext: has 1+ pitting leg edema bilaterally. 1+DP/PT pulse bilaterally. Musculoskeletal: No joint deformities, No joint redness or warmth, no limitation of ROM in spin. Skin: No rashes.  Neuro: Alert, oriented X3, cranial nerves II-XII grossly intact, moves all extremities normally. Psych: Patient is not psychotic, no suicidal or hemocidal ideation.  Labs on Admission: I have personally reviewed following labs and imaging studies  CBC: Recent Labs  Lab 08/14/21 0701  WBC 13.9*  HGB 14.1  HCT 45.8  MCV 96.8  PLT 207   Basic Metabolic Panel: Recent Labs  Lab 08/14/21 0701  NA 141  K 3.9  CL 106  CO2 25  GLUCOSE 169*  BUN 19  CREATININE 0.55  CALCIUM 9.0   GFR: Estimated Creatinine Clearance: 52 mL/min (by C-G formula based on SCr of 0.55 mg/dL). Liver Function Tests: Recent Labs  Lab 08/14/21 0701  AST 31  ALT 10  ALKPHOS 96  BILITOT 0.8  PROT 6.9  ALBUMIN 3.7   No results for input(s): LIPASE, AMYLASE in the last 168 hours. No results for input(s): AMMONIA in the last 168 hours. Coagulation Profile: No results for input(s): INR, PROTIME in the last 168 hours. Cardiac Enzymes: No results for input(s): CKTOTAL, CKMB, CKMBINDEX, TROPONINI in the last 168 hours. BNP (last 3 results) No results for input(s): PROBNP in the last 8760 hours. HbA1C: No results for input(s): HGBA1C in the last 72  hours. CBG: No results for input(s): GLUCAP in the last 168 hours. Lipid Profile: No results for input(s): CHOL, HDL, LDLCALC, TRIG, CHOLHDL, LDLDIRECT in the last 72 hours. Thyroid Function Tests: No results for input(s): TSH, T4TOTAL, FREET4, T3FREE, THYROIDAB in the last 72 hours. Anemia Panel: No results for input(s): VITAMINB12, FOLATE, FERRITIN, TIBC, IRON, RETICCTPCT in the last 72 hours. Urine analysis: No results found for: COLORURINE, APPEARANCEUR, LABSPEC, PHURINE, GLUCOSEU, HGBUR, BILIRUBINUR, KETONESUR, PROTEINUR, UROBILINOGEN, NITRITE, LEUKOCYTESUR Sepsis Labs: @LABRCNTIP (procalcitonin:4,lacticidven:4) ) Recent Results (from the past 240 hour(s))  Resp Panel by RT-PCR (Flu A&B, Covid) Nasopharyngeal Swab     Status: None   Collection Time: 08/14/21  7:01 AM   Specimen: Nasopharyngeal Swab; Nasopharyngeal(NP) swabs in vial transport medium  Result Value Ref Range Status  SARS Coronavirus 2 by RT PCR NEGATIVE NEGATIVE Final    Comment: (NOTE) SARS-CoV-2 target nucleic acids are NOT DETECTED.  The SARS-CoV-2 RNA is generally detectable in upper respiratory specimens during the acute phase of infection. The lowest concentration of SARS-CoV-2 viral copies this assay can detect is 138 copies/mL. A negative result does not preclude SARS-Cov-2 infection and should not be used as the sole basis for treatment or other patient management decisions. A negative result may occur with  improper specimen collection/handling, submission of specimen other than nasopharyngeal swab, presence of viral mutation(s) within the areas targeted by this assay, and inadequate number of viral copies(<138 copies/mL). A negative result must be combined with clinical observations, patient history, and epidemiological information. The expected result is Negative.  Fact Sheet for Patients:  BloggerCourse.com  Fact Sheet for Healthcare Providers:   SeriousBroker.it  This test is no t yet approved or cleared by the [place] FDA and  has been authorized for detection and/or diagnosis of SARS-CoV-2 by FDA under an Emergency Use Authorization (EUA). This EUA will remain  in effect (meaning this test can be used) for the duration of the COVID-19 declaration under Section 564(b)(1) of the Act, 21 U.S.C.section 360bbb-3(b)(1), unless the authorization is terminated  or revoked sooner.       Influenza A by PCR NEGATIVE NEGATIVE Final   Influenza B by PCR NEGATIVE NEGATIVE Final    Comment: (NOTE) The Xpert Xpress SARS-CoV-2/FLU/RSV plus assay is intended as an aid in the diagnosis of influenza from Nasopharyngeal swab specimens and should not be used as a sole basis for treatment. Nasal washings and aspirates are unacceptable for Xpert Xpress SARS-CoV-2/FLU/RSV testing.  Fact Sheet for Patients: BloggerCourse.com  Fact Sheet for Healthcare Providers: SeriousBroker.it  This test is not yet approved or cleared by the [place] FDA and has been authorized for detection and/or diagnosis of SARS-CoV-2 by FDA under an Emergency Use Authorization (EUA). This EUA will remain in effect (meaning this test can be used) for the duration of the COVID-19 declaration under Section 564(b)(1) of the Act, 21 U.S.C. section 360bbb-3(b)(1), unless the authorization is terminated or revoked.  Performed at [facility], [address] [place], [place] [address]      Radiological Exams on Admission: DG Chest Portable 1 View  Result Date: 08/14/2021 CLINICAL DATA:  Shortness of breath and cough EXAM: PORTABLE CHEST 1 VIEW COMPARISON:  06/07/2021 FINDINGS: Sizable hiatal hernia with similar degree of gas distension. Borderline heart size with vascular pedicle widening, significantly limited by rightward rotation. Symmetric generalized interstitial  prominence. Trace pleural effusion is possible. No pneumothorax. IMPRESSION: Cardiomegaly and mild vascular congestion. Electronically Signed   By: Tiburcio Pea M.D.   On: 08/14/2021 07:31      Assessment/Plan Principal Problem:   Acute respiratory failure with hypoxia (HCC) Active Problems:   Acute on chronic systolic CHF (congestive heart failure) (HCC)   HTN (hypertension)   Leukocytosis   Principal Problem:   Acute respiratory failure with hypoxia (HCC) Active Problems:   Acute on chronic systolic CHF (congestive heart failure) (HCC)   HTN (hypertension)   Leukocytosis   Assessment and Plan: * Acute respiratory failure with hypoxia (HCC) Acute respiratory failure with hypoxia due to acute on chronic systolic CHF exacerbation: No 2D echo on record, but per her PCPs clinic note on 09/17/2020, patient had EF of 45-50% on April 2021.  Now patient has 1+ leg edema, positive JVD, crackles on auscultation, elevated BNP 326, vascular congestion  and cardiomegaly on chest x-ray, clinically consistent with CHF exacerbation.  Patient has new 4 L oxygen requirement today.   -Will admit to progressive unit as inpatient -Lasix 40 mg bid by IV (patient received 1 dose of IV Lasix 60 mg in ED) -Continue home Entresto -2d echo -Daily weights -strict I/O's -Low salt diet -Fluid restriction -Obtain REDs Vest reading     Acute on chronic systolic CHF (congestive heart failure) (HCC) See A&P under acute respiratory failure with hypoxia.  HTN (hypertension) - Coreg, Entresto -IV hydralazine as needed  Leukocytosis WBC 13.9, no fever, no source of infection identified.  Possibly reactive. -Check procalcitonin level -Follow-up CBC             DVT ppx: SQ Lovenox  Code Status: Full code per pt and her son  Family Communication:     Yes, patient's son    at bed side.   Disposition Plan:  Anticipate discharge back to previous environment  Consults called:   none  Admission status and  Level of care: Progressive:    as inpt       Severity of Illness:  The appropriate patient status for this patient is INPATIENT. Inpatient status is judged to be reasonable and necessary in order to provide the required intensity of service to ensure the patient's safety. The patient's presenting symptoms, physical exam findings, and initial radiographic and laboratory data in the context of their chronic comorbidities is felt to place them at high risk for further clinical deterioration. Furthermore, it is not anticipated that the patient will be medically stable for discharge from the hospital within 2 midnights of admission.   * I certify that at the point of admission it is my clinical judgment that the patient will require inpatient hospital care spanning beyond 2 midnights from the point of admission due to high intensity of service, high risk for further deterioration and high frequency of surveillance required.*       Date of Service 08/14/2021    Lorretta Harp [facility]   If 7PM-7AM, please contact night-coverage [email or web address] 08/14/2021, 9:29 AM

## 2021-08-14 NOTE — ED Provider Notes (Signed)
? ?[facility] ?Provider Note ? ? ? Event Date/Time  ? First MD Initiated Contact with Patient 08/14/21 [phone]   ?  (approximate) ? ?History  ? ?Chief Complaint: Shortness of Breath ? ?HPI ? ?Diana Garrett is a 88 y.o. female with a past medical history of hypertension, CHF, presents to the emergency department for shortness of breath.  According to the patient since this morning she has been feeling short of breath.  EMS states 85% sat on room air.  No baseline O2 requirement per patient.  Patient does state a cough since yesterday.  No fever.  No chest pain. ? ?Physical Exam  ? ?Triage Vital Signs: ?ED Triage Vitals  ?Enc Vitals Group  ?   BP 08/14/21 0656 (!) 156/88  ?   Pulse Rate 08/14/21 0656 91  ?   Resp 08/14/21 0656 (!) 28  ?   Temp 08/14/21 0656 (!) 97.5 ?F (36.4 ?C)  ?   Temp Source 08/14/21 0656 Oral  ?   SpO2 08/14/21 0656 92 %  ?   Weight 08/14/21 0658 200 lb (90.7 kg)  ?   Height --   ?   Head Circumference --   ?   Peak Flow --   ?   Pain Score --   ?   Pain Loc --   ?   Pain Edu? --   ?   Excl. in GC? --   ? ? ?Most recent vital signs: ?Vitals:  ? 08/14/21 0656  ?BP: (!) 156/88  ?Pulse: 91  ?Resp: (!) 28  ?Temp: (!) 97.5 ?F (36.4 ?C)  ?SpO2: 92%  ? ? ?General: Awake, no distress.  ?CV:  Good peripheral perfusion.  Regular rate and rhythm  ?Resp:  Mild tachypnea.  Slight expiratory wheeze, no rales or rhonchi.  Occasional wet sounding cough during examination. ?Abd:  No distention.  Soft, nontender.  No rebound or guarding. ?Other:  Mild 1+ lower extremity edema bilaterally.  Calves are nontender. ? ? ?ED Results / Procedures / Treatments  ? ?EKG ? ?EKG viewed and interpreted by myself shows a normal sinus rhythm at 89 bpm with a slightly widened QRS, left axis deviation, largely normal intervals with no concerning ST changes. ? ?RADIOLOGY ? ?I have personally reviewed the chest x-ray images appears to show pulmonary edema versus atypical infection. ?Radiology is read the chest  x-ray is cardiomegaly with vascular congestion. ? ? ?MEDICATIONS ORDERED IN ED: ?Medications  ?ipratropium-albuterol (DUONEB) 0.5-2.5 (3) MG/3ML nebulizer solution 3 mL (has no administration in time range)  ?ipratropium-albuterol (DUONEB) 0.5-2.5 (3) MG/3ML nebulizer solution 3 mL (has no administration in time range)  ? ? ? ?IMPRESSION / MDM / ASSESSMENT AND PLAN / ED COURSE  ?I reviewed the triage vital signs and the nursing notes. ? ?Patient presents to the emergency department for shortness of breath starting early this morning.  Patient is satting in the mid 80s on room air currently satting 92% on 4 L.  Patient is moderately tachypneic around 25 to 30 breaths/min has 1+ lower extremity edema but does have slight expiratory wheeze as well.  I reviewed the patient's outside records including a recent PCP note from [facility] 09/17/2020 indicating the patient has hypertension and congestive heart failure was prescribed Lasix 20 mg daily.  Given the slight expiratory wheeze we will treat with DuoNebs while awaiting further work-up.  We will check labs, chest x-ray and COVID swab.  Patient does have mild lower extremity edema given her history  of CHF with mild lower extremity edema and occasional wet sounding cough highly suspect CHF exacerbation/pulmonary edema.  Differential would also include pneumonia, COVID, pneumothorax, ACS.  Given the patient's hypoxia on room air I anticipate likely admission to the hospitalist once her emergency department work-up is been completed. ? ?Chest x-ray consistent with CHF exacerbation/vascular congestion.  BNP is elevated 320.  Reassuringly troponin negative.  Chemistry is normal.  CBC shows slight leukocytosis otherwise within normal limits.  We will start the patient on IV Lasix and admit to the hospital service for ongoing diuresis for likely CHF exacerbation. ? ?FINAL CLINICAL IMPRESSION(S) / ED DIAGNOSES  ? ?Dyspnea ?CHF exacerbation ? ?Note:  This document was prepared using  Dragon voice recognition software and may include unintentional dictation errors. ?  Minna Antis, MD ?08/14/21 [phone] ? ?

## 2021-08-14 NOTE — ED Triage Notes (Signed)
Pt from home via [facility] for shortness of breath. Onset this morning. EMS reports audilble wheezing with 02 sats in mid 80s. Placed on 4L [place] and given 1 Albuterol tx by EMS.  ?

## 2021-08-14 NOTE — Progress Notes (Signed)
*  PRELIMINARY RESULTS* ?Echocardiogram ?2D Echocardiogram has been performed. ? ?Diana Garrett ?08/14/2021, 2:35 PM ?

## 2021-08-14 NOTE — Assessment & Plan Note (Signed)
See A&P under acute respiratory failure with hypoxia. ?

## 2021-08-15 ENCOUNTER — Other Ambulatory Visit: Payer: Self-pay

## 2021-08-15 ENCOUNTER — Encounter: Payer: Self-pay | Admitting: Internal Medicine

## 2021-08-15 DIAGNOSIS — R0602 Shortness of breath: Secondary | ICD-10-CM | POA: Diagnosis not present

## 2021-08-15 DIAGNOSIS — I10 Essential (primary) hypertension: Secondary | ICD-10-CM

## 2021-08-15 DIAGNOSIS — I5043 Acute on chronic combined systolic (congestive) and diastolic (congestive) heart failure: Secondary | ICD-10-CM | POA: Diagnosis not present

## 2021-08-15 DIAGNOSIS — I5042 Chronic combined systolic (congestive) and diastolic (congestive) heart failure: Secondary | ICD-10-CM

## 2021-08-15 DIAGNOSIS — I509 Heart failure, unspecified: Secondary | ICD-10-CM

## 2021-08-15 DIAGNOSIS — J9601 Acute respiratory failure with hypoxia: Secondary | ICD-10-CM | POA: Diagnosis not present

## 2021-08-15 DIAGNOSIS — J208 Acute bronchitis due to other specified organisms: Secondary | ICD-10-CM

## 2021-08-15 DIAGNOSIS — I420 Dilated cardiomyopathy: Secondary | ICD-10-CM | POA: Diagnosis not present

## 2021-08-15 LAB — CBC
HCT: 44.3 % (ref 36.0–46.0)
Hemoglobin: 14.1 g/dL (ref 12.0–15.0)
MCH: 29.6 pg (ref 26.0–34.0)
MCHC: 31.8 g/dL (ref 30.0–36.0)
MCV: 93.1 fL (ref 80.0–100.0)
Platelets: 182 10*3/uL (ref 150–400)
RBC: 4.76 MIL/uL (ref 3.87–5.11)
RDW: 13.1 % (ref 11.5–15.5)
WBC: 8 10*3/uL (ref 4.0–10.5)
nRBC: 0 % (ref 0.0–0.2)

## 2021-08-15 LAB — BASIC METABOLIC PANEL
Anion gap: 10 (ref 5–15)
BUN: 19 mg/dL (ref 8–23)
CO2: 29 mmol/L (ref 22–32)
Calcium: 8.7 mg/dL — ABNORMAL LOW (ref 8.9–10.3)
Chloride: 99 mmol/L (ref 98–111)
Creatinine, Ser: 0.69 mg/dL (ref 0.44–1.00)
GFR, Estimated: >60 mL/min (ref >60)
Glucose, Bld: 111 mg/dL — ABNORMAL HIGH (ref 70–99)
Potassium: 3.5 mmol/L (ref 3.5–5.1)
Sodium: 138 mmol/L (ref 135–145)

## 2021-08-15 LAB — MAGNESIUM: Magnesium: 1.9 mg/dL (ref 1.7–2.4)

## 2021-08-15 MED ORDER — PREDNISONE 20 MG PO TABS
40.0000 mg | ORAL_TABLET | Freq: Every day | ORAL | Status: DC
  Administered 2021-08-16: 40 mg via ORAL
  Filled 2021-08-15: qty 2

## 2021-08-15 MED ORDER — SPIRONOLACTONE 25 MG PO TABS
12.5000 mg | ORAL_TABLET | Freq: Every day | ORAL | Status: DC
  Administered 2021-08-15 – 2021-08-16 (×2): 12.5 mg via ORAL
  Filled 2021-08-15: qty 1
  Filled 2021-08-15: qty 0.5
  Filled 2021-08-15: qty 1
  Filled 2021-08-15: qty 0.5

## 2021-08-15 MED ORDER — FUROSEMIDE 20 MG PO TABS
20.0000 mg | ORAL_TABLET | Freq: Every day | ORAL | Status: DC
  Administered 2021-08-16: 20 mg via ORAL
  Filled 2021-08-15: qty 1

## 2021-08-15 NOTE — TOC CM/SW Note (Signed)
CSW acknowledges heart failure consult. Heart failure nurse navigator is aware.  ° °Verlean Allport, CSW °[phone] ° °

## 2021-08-15 NOTE — Plan of Care (Signed)
?  Problem: Education: ?Goal: Ability to demonstrate management of disease process will improve ?Outcome: Progressing ?  ?Problem: Education: ?Goal: Ability to verbalize understanding of medication therapies will improve ?Outcome: Progressing ?  ?Problem: Cardiac: ?Goal: Ability to achieve and maintain adequate cardiopulmonary perfusion will improve ?Outcome: Progressing ?  ?Problem: Education: ?Goal: Knowledge of General Education information will improve ?Description: Including pain rating scale, medication(s)/side effects and non-pharmacologic comfort measures ?Outcome: Progressing ?  ?Problem: Clinical Measurements: ?Goal: Ability to maintain clinical measurements within normal limits will improve ?Outcome: Progressing ?  ?Problem: Clinical Measurements: ?Goal: Will remain free from infection ?Outcome: Progressing ?  ?

## 2021-08-15 NOTE — Progress Notes (Signed)
PROGRESS NOTE    Diana Garrett  ZOX:[id number] DOB: 21-Sep-1929 DOA: 08/14/2021 PCP: Patient, No Pcp Per (Inactive)  Assessment & Plan:   Principal Problem:   Acute respiratory failure with hypoxia (HCC) Active Problems:   Acute on chronic systolic CHF (congestive heart failure) (HCC)   HTN (hypertension)   Leukocytosis   Acute hypoxic respiratory failure:  EMS stated 85% on RA. Likely secondary to acute on chronic CHF exacerbation. Continue on supplemental oxygen and wean as tolerated   Acute on chronic combined CHF: echo EF 45-50% w/ grade II diastolic dysfunction & global hypokinesis. Continue on IV lasix. Monitor I/Os and daily weights.. CXR shows vascular congestion. BNP 326. Cardio consulted   HTN: continue on coreg, entresto. IV hydralazine    Leukocytosis: resolved   Obesity: BMI 31.1. Complicates overall care & prognosis    DVT prophylaxis: lovenox  Code Status: full  Family Communication: discussed pt's care w/ pt's family at bedside and answered their questions  Disposition Plan: depends on PT/OT recs  Level of care: Progressive  Status is: Inpatient Remains inpatient appropriate because: requiring IV lasix  Consultants:  Cardio   Procedures:   Antimicrobials:  Subjective: Pt c/o shortness of breath, improved from day prior  Objective: Vitals:   08/14/21 1854 08/14/21 1959 08/14/21 2302 08/15/21 0520  BP: (!) 166/81 130/61 (!) 157/76 132/61  Pulse: 81 69 82 74  Resp:  Temp: 98.2 F (36.8 C) 98.2 F (36.8 C) 97.9 F (36.6 C) 97.8 F (36.6 C)  TempSrc:  Oral Oral   SpO2: 94% 96% 94% 96%  Weight:  82.4 kg      Intake/Output Summary (Last 24 hours) at 08/15/2021 0751 Last data filed at 08/14/2021 2200 Gross per 24 hour  Intake --  Output 1400 ml  Net -1400 ml   Filed Weights   08/14/21 0658 08/14/21 1959  Weight: 90.7 kg 82.4 kg    Examination:  General exam: Appears calm and comfortable  Respiratory system: diminished breath  sounds b/l. Cardiovascular system: S1 & S2 +. No  rubs, gallops or clicks Gastrointestinal system: Abdomen is obese, soft and nontender. Normal bowel sounds heard. Central nervous system: Alert and oriented. Moves all extremities  Psychiatry: Judgement and insight appear normal. Mood & affect appropriate.     Data Reviewed: I have personally reviewed following labs and imaging studies  CBC: Recent Labs  Lab 08/14/21 0701 08/15/21 0456  WBC 13.9* 8.0  HGB 14.1 14.1  HCT 45.8 44.3  MCV 96.8 93.1  PLT 207 182   Basic Metabolic Panel: Recent Labs  Lab 08/14/21 0701 08/15/21 0456  NA 141 138  K 3.9 3.5  CL 106 99  CO2 25 29  GLUCOSE 169* 111*  BUN 19 19  CREATININE 0.55 0.69  CALCIUM 9.0 8.7*  MG  --  1.9   GFR: Estimated Creatinine Clearance: 49.5 mL/min (by C-G formula based on SCr of 0.69 mg/dL). Liver Function Tests: Recent Labs  Lab 08/14/21 0701  AST 31  ALT 10  ALKPHOS 96  BILITOT 0.8  PROT 6.9  ALBUMIN 3.7   No results for input(s): LIPASE, AMYLASE in the last 168 hours. No results for input(s): AMMONIA in the last 168 hours. Coagulation Profile: No results for input(s): INR, PROTIME in the last 168 hours. Cardiac Enzymes: No results for input(s): CKTOTAL, CKMB, CKMBINDEX, TROPONINI in the last 168 hours. BNP (last 3 results) No results for input(s): PROBNP in the last 8760 hours. HbA1C: No results  for input(s): HGBA1C in the last 72 hours. CBG: No results for input(s): GLUCAP in the last 168 hours. Lipid Profile: No results for input(s): CHOL, HDL, LDLCALC, TRIG, CHOLHDL, LDLDIRECT in the last 72 hours. Thyroid Function Tests: No results for input(s): TSH, T4TOTAL, FREET4, T3FREE, THYROIDAB in the last 72 hours. Anemia Panel: No results for input(s): VITAMINB12, FOLATE, FERRITIN, TIBC, IRON, RETICCTPCT in the last 72 hours. Sepsis Labs: Recent Labs  Lab 08/14/21 0701  PROCALCITON <0.10    Recent Results (from the past 240 hour(s))  Resp  Panel by RT-PCR (Flu A&B, Covid) Nasopharyngeal Swab     Status: None   Collection Time: 08/14/21  7:01 AM   Specimen: Nasopharyngeal Swab; Nasopharyngeal(NP) swabs in vial transport medium  Result Value Ref Range Status   SARS Coronavirus 2 by RT PCR NEGATIVE NEGATIVE Final    Comment: (NOTE) SARS-CoV-2 target nucleic acids are NOT DETECTED.  The SARS-CoV-2 RNA is generally detectable in upper respiratory specimens during the acute phase of infection. The lowest concentration of SARS-CoV-2 viral copies this assay can detect is 138 copies/mL. A negative result does not preclude SARS-Cov-2 infection and should not be used as the sole basis for treatment or other patient management decisions. A negative result may occur with  improper specimen collection/handling, submission of specimen other than nasopharyngeal swab, presence of viral mutation(s) within the areas targeted by this assay, and inadequate number of viral copies(<138 copies/mL). A negative result must be combined with clinical observations, patient history, and epidemiological information. The expected result is Negative.  Fact Sheet for Patients:  BloggerCourse.comhttps://www.fda.gov/media/152166/download  Fact Sheet for Healthcare Providers:  SeriousBroker.ithttps://www.fda.gov/media/152162/download  This test is no t yet approved or cleared by the [place] FDA and  has been authorized for detection and/or diagnosis of SARS-CoV-2 by FDA under an Emergency Use Authorization (EUA). This EUA will remain  in effect (meaning this test can be used) for the duration of the COVID-19 declaration under Section 564(b)(1) of the Act, 21 U.S.C.section 360bbb-3(b)(1), unless the authorization is terminated  or revoked sooner.       Influenza A by PCR NEGATIVE NEGATIVE Final   Influenza B by PCR NEGATIVE NEGATIVE Final    Comment: (NOTE) The Xpert Xpress SARS-CoV-2/FLU/RSV plus assay is intended as an aid in the diagnosis of influenza from Nasopharyngeal  swab specimens and should not be used as a sole basis for treatment. Nasal washings and aspirates are unacceptable for Xpert Xpress SARS-CoV-2/FLU/RSV testing.  Fact Sheet for Patients: BloggerCourse.comhttps://www.fda.gov/media/152166/download  Fact Sheet for Healthcare Providers: SeriousBroker.ithttps://www.fda.gov/media/152162/download  This test is not yet approved or cleared by the [place] FDA and has been authorized for detection and/or diagnosis of SARS-CoV-2 by FDA under an Emergency Use Authorization (EUA). This EUA will remain in effect (meaning this test can be used) for the duration of the COVID-19 declaration under Section 564(b)(1) of the Act, 21 U.S.C. section 360bbb-3(b)(1), unless the authorization is terminated or revoked.  Performed at [facility], [address] [place], [place] [id number]          Radiology Studies: DG Chest Portable 1 View  Result Date: 08/14/2021 CLINICAL DATA:  Shortness of breath and cough EXAM: PORTABLE CHEST 1 VIEW COMPARISON:  06/07/2021 FINDINGS: Sizable hiatal hernia with similar degree of gas distension. Borderline heart size with vascular pedicle widening, significantly limited by rightward rotation. Symmetric generalized interstitial prominence. Trace pleural effusion is possible. No pneumothorax. IMPRESSION: Cardiomegaly and mild vascular congestion. Electronically Signed   By: Audry RilesJonathan  Watts M.D.  On: 08/14/2021 07:31   ECHOCARDIOGRAM COMPLETE  Result Date: 08/14/2021    ECHOCARDIOGRAM REPORT   Patient Name:   Diana Garrett Date of Exam: 08/14/2021 Medical Rec #:  [id number]     Height:       64.0 in Accession #:    [id number]    Weight:       200.0 lb Date of Birth:  [birth year]     BSA:          1.956 m Patient Age:    88 years      BP:           125/71 mmHg Patient Gender: F             HR:           78 bpm. Exam Location:  [facility] Procedure: 2D Echo, Color Doppler and Cardiac Doppler Indications:     I50.21 congestive heart failure-Acute Systolic   History:         Patient has no prior history of Echocardiogram examinations.                  Risk Factors:Hypertension.  Sonographer:     Humphrey Rolls Referring Phys:  Wynona Neat NIU Diagnosing Phys: Sena Slate  Sonographer Comments: No parasternal window and suboptimal subcostal window. Image acquisition challenging due to patient body habitus. IMPRESSIONS  1. Left ventricular ejection fraction, by estimation, is 40 to 45%. The left ventricle has mildly decreased function. The left ventricle demonstrates global hypokinesis. Left ventricular diastolic parameters are consistent with Grade II diastolic dysfunction (pseudonormalization).  2. Right ventricular systolic function is normal. The right ventricular size is normal.  3. Left atrial size was mildly dilated.  4. The mitral valve is grossly normal. Mild mitral valve regurgitation. No evidence of mitral stenosis.  5. The aortic valve was not well visualized. Aortic valve regurgitation is not visualized. No aortic stenosis is present. Conclusion(s)/Recommendation(s): TECHNICALLY DIFFICULT STUDY D/T CHEST WALL AND LUNG INTERFERENCE. FINDINGS  Left Ventricle: Left ventricular ejection fraction, by estimation, is 40 to 45%. The left ventricle has mildly decreased function. The left ventricle demonstrates global hypokinesis. Left ventricular diastolic parameters are consistent with Grade II diastolic dysfunction (pseudonormalization). Right Ventricle: The right ventricular size is normal. Right vetricular wall thickness was not well visualized. Right ventricular systolic function is normal. Left Atrium: Left atrial size was mildly dilated. Right Atrium: Right atrial size was normal in size. Pericardium: There is no evidence of pericardial effusion. Mitral Valve: The mitral valve is grossly normal. Mild mitral valve regurgitation. No evidence of mitral valve stenosis. MV peak gradient, 6.6 mmHg. The mean mitral valve gradient is 3.0 mmHg. Tricuspid Valve: The tricuspid  valve is not well visualized. Tricuspid valve regurgitation is mild. Aortic Valve: The aortic valve was not well visualized. Aortic valve regurgitation is not visualized. No aortic stenosis is present. Aortic valve mean gradient measures 5.0 mmHg. Aortic valve peak gradient measures 8.5 mmHg. Aortic valve area, by VTI measures 1.63 cm. Pulmonic Valve: The pulmonic valve was not well visualized. Aorta: The aortic root was not well visualized. Venous: The inferior vena cava was not well visualized. IAS/Shunts: The interatrial septum was not well visualized.  LEFT VENTRICLE PLAX 2D LVOT diam:     1.70 cm      Diastology LV SV:         45           LV e' medial:    4.13 cm/s LV SV Index:  23           LV E/e' medial:  28.2 LVOT Area:     2.27 cm     LV e' lateral:   11.40 cm/s                             LV E/e' lateral: 10.2  LV Volumes (MOD) LV vol d, MOD A2C: 92.0 ml LV vol d, MOD A4C: 104.0 ml LV vol s, MOD A2C: 57.5 ml LV vol s, MOD A4C: 68.8 ml LV SV MOD A2C:     34.5 ml LV SV MOD A4C:     104.0 ml LV SV MOD BP:      37.3 ml RIGHT VENTRICLE RV Basal diam:  2.94 cm LEFT ATRIUM             Index        RIGHT ATRIUM          Index LA Vol (A2C):   67.1 ml 34.30 ml/m  RA Area:     8.90 cm LA Vol (A4C):   69.2 ml 35.37 ml/m  RA Volume:   12.70 ml 6.49 ml/m LA Biplane Vol: 71.0 ml 36.29 ml/m  AORTIC VALVE                     PULMONIC VALVE AV Area (Vmax):    1.49 cm      PV Vmax:       0.93 m/s AV Area (Vmean):   1.39 cm      PV Vmean:      63.900 cm/s AV Area (VTI):     1.63 cm      PV VTI:        0.192 m AV Vmax:           146.00 cm/s   PV Peak grad:  3.4 mmHg AV Vmean:          111.000 cm/s  PV Mean grad:  2.0 mmHg AV VTI:            0.275 m AV Peak Grad:      8.5 mmHg AV Mean Grad:      5.0 mmHg LVOT Vmax:         96.00 cm/s LVOT Vmean:        68.000 cm/s LVOT VTI:          0.198 m LVOT/AV VTI ratio: 0.72  AORTA Ao Root diam: 2.60 cm MITRAL VALVE MV Area (PHT): 5.09 cm     SHUNTS MV Area VTI:   2.42 cm      Systemic VTI:  0.20 m MV Peak grad:  6.6 mmHg     Systemic Diam: 1.70 cm MV Mean grad:  3.0 mmHg MV Vmax:       1.28 m/s MV Vmean:      70.5 cm/s MV Decel Time: 149 msec MV E velocity: 116.33 cm/s Sena Slate Electronically signed by Sena Slate Signature Date/Time: 08/14/2021/5:21:01 PM    Final         Scheduled Meds:  carvedilol  6.25 mg Oral BID WC   enoxaparin (LOVENOX) injection  40 mg Subcutaneous Q24H   furosemide  40 mg Intravenous Q12H   latanoprost  1 drop Both Eyes QHS   sacubitril-valsartan  1 tablet Oral BID   timolol  1 drop Left Eye Daily   Continuous Infusions:   LOS: 1 day  Time spent: 30 mins     Charise Killian, MD [facility] Pager [phone]  If 7PM-7AM, please contact night-coverage 08/15/2021, 7:51 AM

## 2021-08-15 NOTE — Evaluation (Signed)
Occupational Therapy Evaluation ?Patient Details ?Name: Diana Garrett ?MRN: [id number] ?DOB: 01/10/1930 ?Today's Date: 08/15/2021 ? ? ?History of Present Illness Diana Garrett is an 89yoF who [facility] with difficulty breathing now admitted with CHF.  ? ?Clinical Impression ?  ?Patient presenting with decreased Ind in self care, balance, functional mobility/transfers, endurance, and safety awareness. Patient's family present to confirm baseline. Pt is legally blind and lives with daughter, son in law, and 2 grandchildren. Family is present 24/7 to assist as needed but pt is mostly independent with self care tasks and mobility without use of AD. Patient currently functioning at min guard - min HHA for functional mobility, standing grooming tasks, and toileting task. Her daughter in room also demonstrating the ability to assist mother in room safely. RN notified with plans to not place purewick and pt call staff or ambulate with family in room for toileting needs to encourage mobility with functional tasks. Patient will benefit from acute OT to increase overall independence in the areas of ADLs, functional mobility, and safety awareness in order to safely discharge home with family.  ?   ? ?Recommendations for follow up therapy are one component of a multi-disciplinary discharge planning process, led by the attending physician.  Recommendations may be updated based on patient status, additional functional criteria and insurance authorization.  ? ?Follow Up Recommendations ? Home health OT  ?  ?Assistance Recommended at Discharge Intermittent Supervision/Assistance  ?Patient can return home with the following A little help with walking and/or transfers;A little help with bathing/dressing/bathroom;Help with stairs or ramp for entrance;Assistance with cooking/housework ? ?  ?Functional Status Assessment ? Patient has had a recent decline in their functional status and demonstrates the ability to make significant  improvements in function in a reasonable and predictable amount of time.  ?Equipment Recommendations ? BSC/3in1;Other (comment) (RW)  ?  ?   ?Precautions / Restrictions Precautions ?Precautions: Fall ?Restrictions ?Weight Bearing Restrictions: No  ? ?  ? ?Mobility Bed Mobility ?Overal bed mobility: Independent ?  ?  ?  ?  ?  ?  ?  ?  ? ?Transfers ?Overall transfer level: Needs assistance ?Equipment used: 1 person hand held assist ?Transfers: Sit to/from Stand, Bed to chair/wheelchair/BSC ?Sit to Stand: Min guard ?Stand pivot transfers: Min guard, Min assist ?  ?  ?  ?  ?  ?  ? ?  ?Balance Overall balance assessment: Needs assistance ?Sitting-balance support: Feet supported ?Sitting balance-Leahy Scale: Good ?  ?  ?Standing balance support: During functional activity, Single extremity supported ?Standing balance-Leahy Scale: Fair ?  ?  ?  ?  ?  ?  ?  ?  ?  ?  ?  ?  ?   ? ?ADL either performed or assessed with clinical judgement  ? ?ADL Overall ADL's : Needs assistance/impaired ?  ?  ?Grooming: Wash/dry hands;Standing;Min guard ?  ?  ?  ?  ?  ?  ?  ?Lower Body Dressing: Min guard;Sit to/from stand ?  ?Toilet Transfer: Minimal assistance;Ambulation ?  ?Toileting- Clothing Manipulation and Hygiene: Minimal assistance;Sit to/from stand ?  ?  ?  ?Functional mobility during ADLs: Min guard;Minimal assistance ?   ? ? ? ?Vision Patient Visual Report: No change from baseline ?   ?   ?   ?   ? ?Pertinent Vitals/Pain Pain Assessment ?Pain Assessment: No/denies pain  ? ? ? ?Hand Dominance Right ?  ?Extremity/Trunk Assessment Upper Extremity Assessment ?Upper Extremity Assessment: Overall WFL for tasks assessed ?  ?  Lower Extremity Assessment ?Lower Extremity Assessment: Overall WFL for tasks assessed ?  ?  ?  ?Communication Communication ?Communication: No difficulties ?  ?Cognition Arousal/Alertness: Awake/alert ?Behavior During Therapy: [facility] for tasks assessed/performed ?Overall Cognitive Status: Within Functional Limits for  tasks assessed ?  ?  ?  ?  ?  ?  ?  ?  ?  ?  ?  ?  ?  ?  ?  ?  ?  ?  ?  ?   ?   ?   ? ? ?Home Living Family/patient expects to be discharged to:: Private residence ?Living Arrangements: Children ?Available Help at Discharge: Family;Available 24 hours/day ?Type of Home: House ?Home Access: Stairs to enter ?Entrance Stairs-Number of Steps: 1 ?Entrance Stairs-Rails: None ?Home Layout: One level ?  ?  ?Bathroom Shower/Tub: Tub/shower unit ?  ?Bathroom Toilet: Standard ?  ?  ?Home Equipment: None ?  ?Additional Comments: Mostly a household ambulator, no recent falls, family assists minGuard to minA for trips out of the house. ?  ? ?  ?Prior Functioning/Environment Prior Level of Function : Independent/Modified Independent ?  ?  ?  ?  ?  ?  ?Mobility Comments: household AMB,no device- pt furniture walks ?ADLs Comments: independent ?  ? ?  ?  ?OT Problem List: Decreased strength;Decreased activity tolerance;Impaired balance (sitting and/or standing);Decreased knowledge of use of DME or AE;Decreased safety awareness ?  ?   ?OT Treatment/Interventions: Self-care/ADL training;Therapeutic exercise;Therapeutic activities;Energy conservation;Patient/family education;Manual therapy;Balance training;DME and/or AE instruction  ?  ?OT Goals(Current goals can be found in the care plan section) Acute Rehab OT Goals ?Patient Stated Goal: to go home ?OT Goal Formulation: With patient ?Time For Goal Achievement: 08/29/21 ?Potential to Achieve Goals: Good ?ADL Goals ?Pt Will Perform Grooming: with modified independence ?Pt Will Perform Lower Body Dressing: with modified independence ?Pt Will Transfer to Toilet: with modified independence ?Pt Will Perform Toileting - Clothing Manipulation and hygiene: with modified independence  ?OT Frequency: Min 2X/week ?  ? ?   ?AM-PAC OT "6 Clicks" Daily Activity     ?Outcome Measure Help from another person eating meals?: None ?Help from another person taking care of personal grooming?: A Little ?Help  from another person toileting, which includes using toliet, bedpan, or urinal?: A Little ?Help from another person bathing (including washing, rinsing, drying)?: A Little ?Help from another person to put on and taking off regular upper body clothing?: None ?Help from another person to put on and taking off regular lower body clothing?: A Little ?6 Click Score: 20 ?  ?End of Session Nurse Communication: Mobility status ? ?Activity Tolerance: Patient tolerated treatment well ?Patient left: in bed;with call bell/phone within reach;with bed alarm set;with family/visitor present ? ?OT Visit Diagnosis: Unsteadiness on feet (R26.81);Muscle weakness (generalized) (M62.81)  ?              ?Time: 1751-0258 ?OT Time Calculation (min): 22 min ?Charges:  OT General Charges ?$OT Visit: 1 Visit ?OT Evaluation ?$OT Eval Moderate Complexity: 1 Mod ?OT Treatments ?$Self Care/Home Management : 8-22 mins ?Jackquline Denmark, MS, OTR/L , CBIS ?ascom [phone]  ?08/15/21, 1:25 PM  ?

## 2021-08-15 NOTE — Progress Notes (Signed)
Nutrition Brief Note ? ?RD drawn to pt from Heart Failure Admission List.  ? ?Wt Readings from Last 15 Encounters:  ?08/14/21 82.4 kg  ?03/23/21 87.1 kg  ?08/09/20 85.3 kg  ? ?Diana Garrett is a 88 y.o. female with medical history significant of sCHF with EF 45-50%), HTN, glaucoma, who presents with shortness of breath. ? ?Pt admitted with respiratory failure secondary to CHF exacerbation.  ? ?Reviewed I/O's: -1.4 L x 24 hours  ? ?UOP: 1.4 L x 24 hours  ? ?Spoke with pt and daughter-in-law at bedside. Pt reports having good appetite PTA. Her daughter-in-law prepares meals (3 meals per day which consist of a meat, starch, and vegetable). Pt shares that she is really anxious about being in the hospital for the first time and did not each much breakfast today because of this.  ? ?Pt and daughter-in-law deny wt loss. Per pt, UBW is around 180#. Per wt hx; pt has experienced a 3.4% wt loss over the past year, which is not significant for time frame.  ? ?Nutrition-Focused physical exam completed. Findings are no fat depletion, no muscle depletion, and mild edema.   ? ?Medications reviewed and include lasix and aldactone.  ? ?Labs reviewed.  ? ?Current diet order is 2 gram sodium, patient is consuming approximately 50-75% of meals at this time. Labs and medications reviewed.  ? ?No nutrition interventions warranted at this time. If nutrition issues arise, please consult RD.  ? ?Loistine Chance, RD, LDN, CDCES ?Registered Dietitian II ?Certified Diabetes Care and Education Specialist ?Please refer to [facility] for RD and/or RD on-call/weekend/after hours pager   ?

## 2021-08-15 NOTE — Consult Note (Signed)
? ?  Heart Failure Nurse Navigator Note ? ?HFmrEF 40 to 45%.  Grade 2 diastolic dysfunction. ? ?She presented to the emergency room with complaints of progressively worsening shortness of breath, productive cough of clear mucus. ? ?Comorbidities: ? ?Hypertension ? ? ?Labs: ? ?Sodium 138, potassium 3.5, chloride 99, CO2 29, BUN 19, creatinine 0.69, anion gap 10, magnesium 1.9, WBC on admission 13.9 hemoglobin 14.1 and hematocrit 44.3 ?Weight is 82.4 kg ?Blood pressure 152/77 ?Intake not documented ?Output 1400 mL. ? ?Medications: ? ?Coreg 6.25 mg 2 times a day with meals ?Entresto 49/51 mg 2 times a day ?Spironolactone 12 and half milligrams daily ?Lasix 60 mg IV x1, to start Lasix 20 mg orally tomorrow. ? ? ? ?Initial meeting with patient who is quietly lying in bed in no acute distress, does have some audible wheezes and the daughter-in-law Olin Hauser that she lives with. ? ?States that she moved up from [place] approximately 2 years ago.  Follows with a PCP at [facility]. ? ?She admits to not always taking her diuretic when they are having family get-togethers or they have somewhere to go.  Dr. Rockey Situ also reinforced the importance of taking the medications as ordered. ? ?Discussed diet and they do eat at restaurants or get takeout from restaurants.  Discussed making healthy choices when restaurant eating.  She states that she does not use salt at the table.  And uses fresh and frozen vegetables. ? ?Discussed follow-up in the outpatient heart failure clinic she has an appointment on March 23 at 11 AM.  She has a 25% ratio of no-show which is 1 out of 4 appointments. ? ?She was given the living with heart failure teaching booklet, zone magnet and information on low-sodium. ? ?Pricilla Riffle RN [facility] ?

## 2021-08-15 NOTE — Evaluation (Signed)
Physical Therapy Evaluation ?Patient Details ?Name: Diana Garrett ?MRN: [id number] ?DOB: 04/22/1930 ?Today's Date: 08/15/2021 ? ?History of Present Illness ? Diana Garrett is an 89yoF who comes to [facility] with difficulty breathing now admitted with CHF.  ?Clinical Impression ? Pt admitted with above diagnosis. Pt currently with functional limitations due to the deficits listed below (see "PT Problem List"). Upon entry, pt in bed, awake and agreeable to participate. The pt is alert, pleasant, interactive, and able to provide info regarding prior level of function, both in tolerance and independence. Pt has no acute weakness or imbalance in session, moves fairly well with bed mobility and transfers, but declines additional AMB as she just finished with this OT. Pt on room air throughout session sats in upper 90s%. Patient's performance this date reveals decreased ability, independence, and tolerance in performing all basic mobility required for performance of activities of daily living. Pt requires additional DME, close physical assistance, and cues for safe participate in mobility. Pt will benefit from skilled PT intervention to increase independence and safety with basic mobility in preparation for discharge to the venue listed below.   ? ?   ?   ? ?Recommendations for follow up therapy are one component of a multi-disciplinary discharge planning process, led by the attending physician.  Recommendations may be updated based on patient status, additional functional criteria and insurance authorization. ? ?Follow Up Recommendations Home health PT ? ?  ?Assistance Recommended at Discharge Set up Supervision/Assistance  ?Patient can return home with the following ?   ? ?  ?Equipment Recommendations None recommended by PT  ?Recommendations for Other Services ?    ?  ?Functional Status Assessment Patient has had a recent decline in their functional status and demonstrates the ability to make significant improvements in function  in a reasonable and predictable amount of time.  ? ?  ?Precautions / Restrictions Precautions ?Precautions: Fall ?Restrictions ?Weight Bearing Restrictions: No  ? ?  ? ?Mobility ? Bed Mobility ?Overal bed mobility: Independent ?  ?  ?  ?  ?  ?  ?  ?  ? ?Transfers ?Overall transfer level: Independent ?  ?  ?  ?  ?  ?  ?  ?  ?  ?  ? ?Ambulation/Gait ?Ambulation/Gait assistance:  (declines AMB at this time, just finished AMB in hall with OT) ?  ?  ?  ?  ?  ?  ?  ? ?Stairs ?  ?  ?  ?  ?  ? ?Wheelchair Mobility ?  ? ?Modified Rankin (Stroke Patients Only) ?  ? ?  ? ?Balance   ?  ?  ?  ?  ?  ?  ?  ?  ?  ?  ?  ?  ?  ?  ?  ?  ?  ?  ?   ? ? ? ?Pertinent Vitals/Pain Pain Assessment ?Pain Assessment: No/denies pain  ? ? ?Home Living Family/patient expects to be discharged to:: Private residence ?Living Arrangements: Children (Son, DIL, 2 grands (teenage)) ?  ?Type of Home: House ?Home Access: Stairs to enter ?  ?Entrance Stairs-Number of Steps: 1 ?  ?Home Layout: One level ?Home Equipment: None ?Additional Comments: Mostly a household ambulator, no recent falls, family assists minGuard to minA for trips out of the house.  ?  ?Prior Function Prior Level of Function : Independent/Modified Independent ?  ?  ?  ?  ?  ?  ?Mobility Comments: household AMB,no device ?ADLs Comments: independent ?  ? ? ?  Hand Dominance  ? Dominant Hand: Right ? ?  ?Extremity/Trunk Assessment  ? Upper Extremity Assessment ?Upper Extremity Assessment: Overall WFL for tasks assessed ?  ? ?Lower Extremity Assessment ?Lower Extremity Assessment: Overall WFL for tasks assessed ?  ? ?   ?Communication  ?    ?Cognition Arousal/Alertness: Awake/alert ?Behavior During Therapy: [facility] for tasks assessed/performed ?Overall Cognitive Status: Within Functional Limits for tasks assessed ?  ?  ?  ?  ?  ?  ?  ?  ?  ?  ?  ?  ?  ?  ?  ?  ?  ?  ?  ? ?  ?General Comments   ? ?  ?Exercises    ? ?Assessment/Plan  ?  ?PT Assessment Patient needs continued PT services  ?PT Problem  List Decreased strength;Decreased activity tolerance;Decreased mobility ? ?   ?  ?PT Treatment Interventions Patient/family education;DME instruction;Balance training;Gait training;Functional mobility training;Therapeutic activities;Therapeutic exercise   ? ?PT Goals (Current goals can be found in the Care Plan section)  ?Acute Rehab PT Goals ?Patient Stated Goal: regain breathing ability with AMB ?PT Goal Formulation: With patient ?Time For Goal Achievement: 08/29/21 ?Potential to Achieve Goals: Good ? ?  ?Frequency   ?  ? ? ?Co-evaluation   ?  ?  ?  ?  ? ? ?  ?AM-PAC PT "6 Clicks" Mobility  ?Outcome Measure Help needed turning from your back to your side while in a flat bed without using bedrails?: None ?Help needed moving from lying on your back to sitting on the side of a flat bed without using bedrails?: None ?Help needed moving to and from a bed to a chair (including a wheelchair)?: None ?Help needed standing up from a chair using your arms (e.g., wheelchair or bedside chair)?: None ?Help needed to walk in hospital room?: A Little ?Help needed climbing 3-5 steps with a railing? : A Little ?6 Click Score: 22 ? ?  ?End of Session   ?Activity Tolerance: Patient tolerated treatment well;No increased pain;Patient limited by fatigue ?Patient left: in bed;with family/visitor present;with call bell/phone within reach ?Nurse Communication: Mobility status ?PT Visit Diagnosis: Difficulty in walking, not elsewhere classified (R26.2);Dizziness and giddiness (R42);Other abnormalities of gait and mobility (R26.89) ?  ? ?Time: 1121-1140 ?PT Time Calculation (min) (ACUTE ONLY): 19 min ? ? ?Charges:   PT Evaluation ?$PT Eval Moderate Complexity: 1 Mod ?  ?  ?   ? ?12:44 PM, 08/15/21 ?Diana Garrett, PT, DPT ?Physical Therapist - [place] ?[facility]  ?[phone] (ASCOM)  ? ? ?Diana Garrett C ?08/15/2021, 12:40 PM ? ?

## 2021-08-15 NOTE — TOC Initial Note (Signed)
Transition of Care (TOC) - Initial/Assessment Note  ? ? ?Patient Details  ?Name: Diana Garrett ?MRN: [id number] ?Date of Birth: 1929-11-16 ? ?Transition of Care (TOC) CM/SW Contact:    ?Margarito Liner, LCSW ?Phone Number: ?08/15/2021, 1:56 PM ? ?Clinical Narrative:  Patient sleeping. Daughter-in-law at bedside. CSW introduced role and explained that therapy recommendations would be discussed. Daughter-in-law unsure about home health but wants to see how she does during hospitalization. Gave CMS scores for agencies that serve her zip code. PCP is Cleon Dew, FNP at [facility] in [place]. Added to chart. Will follow up closer to discharge. No further concerns. Will continue to follow patient and her family for support and facilitate return home when stable. ? ?Expected Discharge Plan: Home w Home Health Services ?Barriers to Discharge: Continued Medical Work up ? ? ?Patient Goals and CMS Choice ?  ?CMS Medicare.gov Compare Post Acute Care list provided to:: Other (Comment Required) (Daughter-in-law) ?  ? ?Expected Discharge Plan and Services ?Expected Discharge Plan: Home w Home Health Services ?  ?  ?Post Acute Care Choice:  (TBD) ?Living arrangements for the past 2 months: Single Family Home ?                ?  ?  ?  ?  ?  ?  ?  ?  ?  ?  ? ?Prior Living Arrangements/Services ?Living arrangements for the past 2 months: Single Family Home ?Lives with:: Adult Children, Relatives ?Patient language and need for interpreter reviewed:: Yes ?Do you feel safe going back to the place where you live?: Yes      ?Need for Family Participation in Patient Care: Yes (Comment) ?Care giver support system in place?: Yes (comment) ?  ?Criminal Activity/Legal Involvement Pertinent to Current Situation/Hospitalization: No - Comment as needed ? ?Activities of Daily Living ?Home Assistive Devices/Equipment: None ?ADL Screening (condition at time of admission) ?Patient's cognitive ability adequate to safely complete daily  activities?: Yes ?Is the patient deaf or have difficulty hearing?: No ?Does the patient have difficulty seeing, even when wearing glasses/contacts?: Yes ?Does the patient have difficulty concentrating, remembering, or making decisions?: No ?Patient able to express need for assistance with ADLs?: Yes ?Does the patient have difficulty dressing or bathing?: No ?Independently performs ADLs?: Yes (appropriate for developmental age) ?Does the patient have difficulty walking or climbing stairs?: No ?Weakness of Legs: None ?Weakness of Arms/Hands: None ? ?Permission Sought/Granted ?Permission sought to share information with : Family Supports ?  ?   ?   ? Permission granted to share info w Relationship: Daughter-in-law ?   ? ?Emotional Assessment ?Appearance:: Appears stated age ?Attitude/Demeanor/Rapport: Unable to Assess ?Affect (typically observed): Unable to Assess ?Orientation: : Oriented to Self, Oriented to Place, Oriented to  Time, Oriented to Situation ?Alcohol / Substance Use: Not Applicable ?Psych Involvement: No (comment) ? ?Admission diagnosis:  SOB (shortness of breath) [R06.02] ?Hypoxia [R09.02] ?Acute respiratory failure with hypoxia (HCC) [J96.01] ?Acute on chronic systolic CHF (congestive heart failure) (HCC) [I50.23] ?Acute on chronic congestive heart failure, unspecified heart failure type (HCC) [I50.9] ?Patient Active Problem List  ? Diagnosis Date Noted  ? Chronic CHF (congestive heart failure) (HCC) 08/15/2021  ? Viral bronchitis 08/15/2021  ? Dilated cardiomyopathy (HCC) 08/15/2021  ? Acute respiratory failure with hypoxia (HCC) 08/14/2021  ? HTN (hypertension) 08/14/2021  ? Chronic systolic CHF (congestive heart failure) (HCC) 08/14/2021  ? Acute on chronic systolic CHF (congestive heart failure) (HCC) 08/14/2021  ? Leukocytosis 08/14/2021  ? ?PCP:  Cleon Dew, FNP ?Pharmacy:   ?[id number] - [place], [place] - [address] AT [address] ?[address] ?[place] [address] ?Phone: [phone] Fax: [phone] ? ? ? ? ?Social Determinants of Health (SDOH) Interventions ?  ? ?Readmission Risk Interventions ?No flowsheet data found. ? ? ?

## 2021-08-15 NOTE — Consult Note (Signed)
Cardiology Consult    Patient ID: Diana Garrett MRN: [id number], DOB/AGE: May 16, 1930   Admit date: 08/14/2021 Date of Consult: 08/15/2021  Primary Physician: Patient, No Pcp Per (Inactive) Primary Cardiologist: Julien Nordmann, MD - new Requesting Provider: Wilfred Lacy, MD  Patient Profile    Diana Garrett is a 88 y.o. female with a history of HFmrEF, HTN, obesity, anxiety, and glaucoma, who is being seen today for the evaluation of CHF at the request of Dr. Mayford Knife.  Past Medical History   Past Medical History:  Diagnosis Date   Anxiety    Chronic HFmrEF    a. 09/2019 EF 45-50%; b. 08/2021 Echo: EF 40-45%, glob HK, GrII, DD, nl RV fxn, mildly dil LA, mild MR.   Glaucoma    History of stress test    a. Reports normal stress test in [place], [place] ~ 2017.   HTN (hypertension)    Morbid obesity (HCC)     Past Surgical History:  Procedure Laterality Date   CESAREAN SECTION       Allergies  Allergies  Allergen Reactions   Aspirin    Iodine    Mercury    Tylenol [Acetaminophen]     History of Present Illness    88 year old female with the above past medical history including heart failure with midrange ejection fraction, hypertension, obesity, anxiety, and glaucoma.  She moved to [place] almost 2 years ago, and has been living in [place] with her son and daughter-in-law.  She notes that prior to COVID, she was fairly active but ever since, has been more sedentary and rarely leaves the home.  Her cardiac history dates back approximately 8 to 10 years, and she was previously followed by cardiologist in [place], [place] for HRmrEF.  Notes indicate that EF in April 2021 was 45 to 50%.  Patient says that she had a stress test approximately 5 years ago, which she believes was normal.  She says that she thinks she was offered catheterization at one point but her son in [place] declined, secondary to her age.  She has been conservatively managed with carvedilol, Entresto, and Lasix 20 mg  daily, and has done quite well.  She has never previously been hospitalized for heart failure.  She notes that several days a week, she will skip her Lasix dose and on other days, if she needs to do something in the morning, she will hold off on taking it and then maybe take half a tablet later in the afternoon.  She does not add salt to her food but family members indicate that they do bring in fast food several days a week.  On 3/8, Diana Garrett awoke and immediately noted wheezing and dyspnea.  She called her dtr-in-law, who called EMS and pt was transported to [facility].  Here, she was hemodynamically stable.  BNP was mildly elevated @ 326.8, HsTrops nl, and labs otw unremarkable.  CXR w/ cardiomegaly and vasc congestion.  She was admitted and placed on IV lasix.  I/O inaccurate, though she reports good UO last night/this AM.  She cont to note wheezing, which is unusual for her.  She has prn albuterol available, but hasn't requested.  She denies chest pain, palpitations, pnd, orthopnea, n, v, dizziness, syncope, edema, weight gain, or early satiety.   Inpatient Medications     carvedilol  6.25 mg Oral BID WC   enoxaparin (LOVENOX) injection  40 mg Subcutaneous Q24H   [START ON 08/16/2021] furosemide  20 mg Oral Daily  latanoprost  1 drop Both Eyes QHS   sacubitril-valsartan  1 tablet Oral BID   spironolactone  12.5 mg Oral Daily   timolol  1 drop Left Eye Daily    Family History    Family History  Problem Relation Age of Onset   Hypertension Father    Heart disease Father    She indicated that her mother is deceased. She indicated that her father is deceased.   Social History    Social History   Socioeconomic History   Marital status: Widowed    Spouse name: Not on file   Number of children: Not on file   Years of education: Not on file   Highest education level: Not on file  Occupational History   Not on file  Tobacco Use   Smoking status: Never   Smokeless tobacco: Never   Vaping Use   Vaping Use: Never used  Substance and Sexual Activity   Alcohol use: Yes    Comment: rare   Drug use: Never   Sexual activity: Not on file  Other Topics Concern   Not on file  Social History Narrative   Lives in [place], [place] w/ son and dtr-in-law.  Sedentary - doesn't leave the house often.   Social Determinants of Health   Financial Resource Strain: Not on file  Food Insecurity: Not on file  Transportation Needs: Not on file  Physical Activity: Not on file  Stress: Not on file  Social Connections: Not on file  Intimate Partner Violence: Not on file     Review of Systems    General:  No chills, fever, night sweats or weight changes.  Cardiovascular:  No chest pain, +++ dyspnea and wheezing on 3/8, occas LE edema, no orthopnea, palpitations, paroxysmal nocturnal dyspnea. Dermatological: No rash, lesions/masses Respiratory: No cough, +++ wheezing and dyspnea Urologic: No hematuria, dysuria Abdominal:   No nausea, vomiting, diarrhea, bright red blood per rectum, melena, or hematemesis Neurologic:  No visual changes, wkns, changes in mental status. All other systems reviewed and are otherwise negative except as noted above.  Physical Exam    Blood pressure 140/71, pulse 68, temperature 98.1 F (36.7 C), resp. rate 16, weight 82.4 kg, SpO2 96 %.  General: Pleasant, NAD Psych: Normal affect. Neuro: Alert and oriented X 3. Moves all extremities spontaneously. HEENT: Normal  Neck: Supple without bruits or JVD. Lungs:  Resp regular and unlabored, exp wheezing noted throughout. Heart: RRR no s3, s4, or murmurs. Abdomen: Soft, non-tender, non-distended, BS + x 4.  Extremities: No clubbing, cyanosis or edema. DP/PT2+, Radials 2+ and equal bilaterally.  Labs    Cardiac Enzymes Recent Labs  Lab 08/14/21 0701  TROPONINIHS 10      Lab Results  Component Value Date   WBC 8.0 08/15/2021   HGB 14.1 08/15/2021   HCT 44.3 08/15/2021   MCV 93.1 08/15/2021   PLT  182 08/15/2021    Recent Labs  Lab 08/14/21 0701 08/15/21 0456  NA 141 138  K 3.9 3.5  CL 106 99  CO2 25 29  BUN 19 19  CREATININE 0.55 0.69  CALCIUM 9.0 8.7*  PROT 6.9  --   BILITOT 0.8  --   ALKPHOS 96  --   ALT 10  --   AST 31  --   GLUCOSE 169* 111*   BNP    Component Value Date/Time   BNP 326.8 (H) 08/14/2021 0701    Radiology Studies    DG Chest Portable 1  View  Result Date: 08/14/2021 CLINICAL DATA:  Shortness of breath and cough EXAM: PORTABLE CHEST 1 VIEW COMPARISON:  06/07/2021 FINDINGS: Sizable hiatal hernia with similar degree of gas distension. Borderline heart size with vascular pedicle widening, significantly limited by rightward rotation. Symmetric generalized interstitial prominence. Trace pleural effusion is possible. No pneumothorax. IMPRESSION: Cardiomegaly and mild vascular congestion. Electronically Signed   By: Tiburcio PeaJonathan  Watts M.D.   On: 08/14/2021 07:31   ECG & Cardiac Imaging   RSR, 89, LAD, LVH, lateral TWI - personally reviewed.  Assessment & Plan    1.  Acute on chronic HFmrEF/NICM:  Pt w/ a h/o presumed NICM (neg stress test ~ 5 yrs ago) and EF of 45-50% in 09/2019.  She awoke w/ dyspnea and wheezing on 3/8, prompting ED eval.  BNP mildly elevated @ 326.8 and CXR w/ vasc congestion.  She has responded well to IV lasix and wt is @ baseline of 180-182 lbs.  Echo 3/8 w/ EF of 40-45%, glob HK, gr II DD, and mild MR.  She is euvolemic on exam.  Discussed her CHF history, med mgmt, and dietary habits @ home @ length today w/ pt and family.  She often skips lasix (Rx 20mg  daily) or takes a reduced dose.  Also a fair amt of salt in her diet r/t family bringing in fast food.  She has been on coreg and entresto long-term.  She received IV lasix this AM and I will switch to 20mg  PO starting tomorrow.  I will also add spiro 12.5mg  daily.  She cont to wheeze and albuterol PRN is ordered.  2.  Acute resp failure:  See #1.  Pt continues to wheeze.  No evidence of  infection.  PRN albuterol ordered but pt wasn't aware and thus, hasn't requested.  I spoke w/ RN, who will administer.  3.  Essential HTN:  BP variable.  Cont ? blocker, entresto.  Switching lasix to PO for tomorrow.  Adding spiro.  4.  Anxiety:  pt and family note occas anxiety attacks.  Pt says that she's been on several different prn benzos in the past, but "none of them worked."  We discussed that she might benefit from an SSRI/SNRI, however, she is not interested in adding daily medication.  Signed, Nicolasa Duckinghristopher Amica Harron, NP 08/15/2021, 11:11 AM  For questions or updates, please contact   Please consult [email or web address] for contact info under Cardiology/STEMI.

## 2021-08-16 DIAGNOSIS — E876 Hypokalemia: Secondary | ICD-10-CM

## 2021-08-16 DIAGNOSIS — J40 Bronchitis, not specified as acute or chronic: Secondary | ICD-10-CM | POA: Diagnosis not present

## 2021-08-16 DIAGNOSIS — I5043 Acute on chronic combined systolic (congestive) and diastolic (congestive) heart failure: Secondary | ICD-10-CM | POA: Diagnosis not present

## 2021-08-16 DIAGNOSIS — J9601 Acute respiratory failure with hypoxia: Secondary | ICD-10-CM | POA: Diagnosis not present

## 2021-08-16 LAB — BASIC METABOLIC PANEL
Anion gap: 9 (ref 5–15)
BUN: 33 mg/dL — ABNORMAL HIGH (ref 8–23)
CO2: 32 mmol/L (ref 22–32)
Calcium: 8.9 mg/dL (ref 8.9–10.3)
Chloride: 98 mmol/L (ref 98–111)
Creatinine, Ser: 0.65 mg/dL (ref 0.44–1.00)
GFR, Estimated: >60 mL/min (ref >60)
Glucose, Bld: 103 mg/dL — ABNORMAL HIGH (ref 70–99)
Potassium: 3 mmol/L — ABNORMAL LOW (ref 3.5–5.1)
Sodium: 139 mmol/L (ref 135–145)

## 2021-08-16 MED ORDER — AZITHROMYCIN 250 MG PO TABS
250.0000 mg | ORAL_TABLET | Freq: Every day | ORAL | Status: DC
  Administered 2021-08-16: 250 mg via ORAL
  Filled 2021-08-16: qty 1

## 2021-08-16 MED ORDER — AZITHROMYCIN 250 MG PO TABS: 250.0000 mg | ORAL_TABLET | Freq: Every day | ORAL | 0 refills | Status: AC

## 2021-08-16 MED ORDER — SPIRONOLACTONE 25 MG PO TABS: 12.5000 mg | ORAL_TABLET | Freq: Every day | ORAL | 0 refills | Status: DC

## 2021-08-16 MED ORDER — PREDNISONE 20 MG PO TABS: 40.0000 mg | ORAL_TABLET | Freq: Every day | ORAL | 0 refills | Status: AC

## 2021-08-16 MED ORDER — POTASSIUM CHLORIDE CRYS ER 20 MEQ PO TBCR
40.0000 meq | EXTENDED_RELEASE_TABLET | Freq: Two times a day (BID) | ORAL | Status: DC
  Administered 2021-08-16: 40 meq via ORAL
  Filled 2021-08-16: qty 2

## 2021-08-16 NOTE — Progress Notes (Signed)
PROGRESS NOTE    Diana Garrett  OZD:[id number] DOB: March 06, 1930 DOA: 08/14/2021 PCP: Cleon Dew, FNP  Assessment & Plan:   Principal Problem:   Acute respiratory failure with hypoxia (HCC) Active Problems:   HTN (hypertension)   Leukocytosis   Chronic CHF (congestive heart failure) (HCC)   Viral bronchitis   Dilated cardiomyopathy (HCC)   Acute hypoxic respiratory failure:  EMS stated 85% on RA. Likely secondary to acute on chronic CHF exacerbation. Weaned off of supplemental oxygen today    Acute on chronic combined CHF: echo EF 45-50% w/ grade II diastolic dysfunction & global hypokinesis. Continue on lasix, coreg & entresto. Monitor I/Os and daily weights. Cardio is following and recs    Hypokalemia: KCl repleated  HTN: continue on entresto, coreg. IV hydralazine    Leukocytosis: resolved   Obesity: BMI 30.3. Complicates overall care & prognosis      DVT prophylaxis: lovenox  Code Status: full  Family Communication:  Disposition Plan: likely d/c home w/ home health   Level of care: Progressive  Status is: Inpatient Remains inpatient appropriate because: likely d/c home tomorrw  Consultants:  Cardio   Procedures:   Antimicrobials:  Subjective: Pt c/o wheezing and cough   Objective: Vitals:   08/15/21 2048 08/16/21 0020 08/16/21 0428 08/16/21 0723  BP: 128/60 136/63 136/79 140/67  Pulse: 70 (!) 59 66 71  Resp: 20 20 20 17   Temp: 98.4 F (36.9 C) 98.4 F (36.9 C) 97.7 F (36.5 C) 98 F (36.7 C)  TempSrc: Oral Oral    SpO2: 94% 94% 96% 95%  Weight:   80.1 kg     Intake/Output Summary (Last 24 hours) at 08/16/2021 0734 Last data filed at 08/15/2021 1842 Gross per 24 hour  Intake 520 ml  Output 2 ml  Net 518 ml   Filed Weights   08/14/21 0658 08/14/21 1959 08/16/21 0428  Weight: 90.7 kg 82.4 kg 80.1 kg    Examination:  General exam: Appears calm but uncomfortable  Respiratory system: decreased breath sounds b/l. No rales   Cardiovascular system: S1/S2+. No rubs or clicks  Gastrointestinal system: Abd is soft, NT, obese & hypoactive bowel sounds  Central nervous system: alert and oriented. Moves all extremities  Psychiatry: Judgement and insight appears normal. Flat mood and affect.     Data Reviewed: I have personally reviewed following labs and imaging studies  CBC: Recent Labs  Lab 08/14/21 0701 08/15/21 0456  WBC 13.9* 8.0  HGB 14.1 14.1  HCT 45.8 44.3  MCV 96.8 93.1  PLT 207 182   Basic Metabolic Panel: Recent Labs  Lab 08/14/21 0701 08/15/21 0456 08/16/21 0523  NA 141 138 139  K 3.9 3.5 3.0*  CL 106 99 98  CO2 25 29 32  GLUCOSE 169* 111* 103*  BUN 19 19 33*  CREATININE 0.55 0.69 0.65  CALCIUM 9.0 8.7* 8.9  MG  --  1.9  --    GFR: Estimated Creatinine Clearance: 48.8 mL/min (by C-G formula based on SCr of 0.65 mg/dL). Liver Function Tests: Recent Labs  Lab 08/14/21 0701  AST 31  ALT 10  ALKPHOS 96  BILITOT 0.8  PROT 6.9  ALBUMIN 3.7   No results for input(s): LIPASE, AMYLASE in the last 168 hours. No results for input(s): AMMONIA in the last 168 hours. Coagulation Profile: No results for input(s): INR, PROTIME in the last 168 hours. Cardiac Enzymes: No results for input(s): CKTOTAL, CKMB, CKMBINDEX, TROPONINI in the last 168 hours. BNP (last 3  results) No results for input(s): PROBNP in the last 8760 hours. HbA1C: No results for input(s): HGBA1C in the last 72 hours. CBG: No results for input(s): GLUCAP in the last 168 hours. Lipid Profile: No results for input(s): CHOL, HDL, LDLCALC, TRIG, CHOLHDL, LDLDIRECT in the last 72 hours. Thyroid Function Tests: No results for input(s): TSH, T4TOTAL, FREET4, T3FREE, THYROIDAB in the last 72 hours. Anemia Panel: No results for input(s): VITAMINB12, FOLATE, FERRITIN, TIBC, IRON, RETICCTPCT in the last 72 hours. Sepsis Labs: Recent Labs  Lab 08/14/21 0701  PROCALCITON <0.10    Recent Results (from the past 240  hour(s))  Resp Panel by RT-PCR (Flu A&B, Covid) Nasopharyngeal Swab     Status: None   Collection Time: 08/14/21  7:01 AM   Specimen: Nasopharyngeal Swab; Nasopharyngeal(NP) swabs in vial transport medium  Result Value Ref Range Status   SARS Coronavirus 2 by RT PCR NEGATIVE NEGATIVE Final    Comment: (NOTE) SARS-CoV-2 target nucleic acids are NOT DETECTED.  The SARS-CoV-2 RNA is generally detectable in upper respiratory specimens during the acute phase of infection. The lowest concentration of SARS-CoV-2 viral copies this assay can detect is 138 copies/mL. A negative result does not preclude SARS-Cov-2 infection and should not be used as the sole basis for treatment or other patient management decisions. A negative result may occur with  improper specimen collection/handling, submission of specimen other than nasopharyngeal swab, presence of viral mutation(s) within the areas targeted by this assay, and inadequate number of viral copies(<138 copies/mL). A negative result must be combined with clinical observations, patient history, and epidemiological information. The expected result is Negative.  Fact Sheet for Patients:  BloggerCourse.com  Fact Sheet for Healthcare Providers:  SeriousBroker.it  This test is no t yet approved or cleared by the [place] FDA and  has been authorized for detection and/or diagnosis of SARS-CoV-2 by FDA under an Emergency Use Authorization (EUA). This EUA will remain  in effect (meaning this test can be used) for the duration of the COVID-19 declaration under Section 564(b)(1) of the Act, 21 U.S.C.section 360bbb-3(b)(1), unless the authorization is terminated  or revoked sooner.       Influenza A by PCR NEGATIVE NEGATIVE Final   Influenza B by PCR NEGATIVE NEGATIVE Final    Comment: (NOTE) The Xpert Xpress SARS-CoV-2/FLU/RSV plus assay is intended as an aid in the diagnosis of influenza from  Nasopharyngeal swab specimens and should not be used as a sole basis for treatment. Nasal washings and aspirates are unacceptable for Xpert Xpress SARS-CoV-2/FLU/RSV testing.  Fact Sheet for Patients: BloggerCourse.com  Fact Sheet for Healthcare Providers: SeriousBroker.it  This test is not yet approved or cleared by the [place] FDA and has been authorized for detection and/or diagnosis of SARS-CoV-2 by FDA under an Emergency Use Authorization (EUA). This EUA will remain in effect (meaning this test can be used) for the duration of the COVID-19 declaration under Section 564(b)(1) of the Act, 21 U.S.C. section 360bbb-3(b)(1), unless the authorization is terminated or revoked.  Performed at [facility], [address], [place], [place] [address]          Radiology Studies: ECHOCARDIOGRAM COMPLETE  Result Date: 08/14/2021    ECHOCARDIOGRAM REPORT   Patient Name:   Diana Garrett Date of Exam: 08/14/2021 Medical Rec #:  [id number]     Height:       64.0 in Accession #:    [id number]    Weight:       200.0 lb  Date of Birth:  1930/02/15     BSA:          1.956 m Patient Age:    89 years      BP:           125/71 mmHg Patient Gender: F             HR:           78 bpm. Exam Location:  [facility] Procedure: 2D Echo, Color Doppler and Cardiac Doppler Indications:     I50.21 congestive heart failure-Acute Systolic  History:         Patient has no prior history of Echocardiogram examinations.                  Risk Factors:Hypertension.  Sonographer:     Humphrey RollsJoan Heiss Referring Phys:  Wynona [id number] XILIN NIU Diagnosing Phys: Sena Slateyan Orgel  Sonographer Comments: No parasternal window and suboptimal subcostal window. Image acquisition challenging due to patient body habitus. IMPRESSIONS  1. Left ventricular ejection fraction, by estimation, is 40 to 45%. The left ventricle has mildly decreased function. The left ventricle demonstrates global hypokinesis. Left  ventricular diastolic parameters are consistent with Grade II diastolic dysfunction (pseudonormalization).  2. Right ventricular systolic function is normal. The right ventricular size is normal.  3. Left atrial size was mildly dilated.  4. The mitral valve is grossly normal. Mild mitral valve regurgitation. No evidence of mitral stenosis.  5. The aortic valve was not well visualized. Aortic valve regurgitation is not visualized. No aortic stenosis is present. Conclusion(s)/Recommendation(s): TECHNICALLY DIFFICULT STUDY D/T CHEST WALL AND LUNG INTERFERENCE. FINDINGS  Left Ventricle: Left ventricular ejection fraction, by estimation, is 40 to 45%. The left ventricle has mildly decreased function. The left ventricle demonstrates global hypokinesis. Left ventricular diastolic parameters are consistent with Grade II diastolic dysfunction (pseudonormalization). Right Ventricle: The right ventricular size is normal. Right vetricular wall thickness was not well visualized. Right ventricular systolic function is normal. Left Atrium: Left atrial size was mildly dilated. Right Atrium: Right atrial size was normal in size. Pericardium: There is no evidence of pericardial effusion. Mitral Valve: The mitral valve is grossly normal. Mild mitral valve regurgitation. No evidence of mitral valve stenosis. MV peak gradient, 6.6 mmHg. The mean mitral valve gradient is 3.0 mmHg. Tricuspid Valve: The tricuspid valve is not well visualized. Tricuspid valve regurgitation is mild. Aortic Valve: The aortic valve was not well visualized. Aortic valve regurgitation is not visualized. No aortic stenosis is present. Aortic valve mean gradient measures 5.0 mmHg. Aortic valve peak gradient measures 8.5 mmHg. Aortic valve area, by VTI measures 1.63 cm. Pulmonic Valve: The pulmonic valve was not well visualized. Aorta: The aortic root was not well visualized. Venous: The inferior vena cava was not well visualized. IAS/Shunts: The interatrial  septum was not well visualized.  LEFT VENTRICLE PLAX 2D LVOT diam:     1.70 cm      Diastology LV SV:         45           LV e' medial:    4.13 cm/s LV SV Index:   23           LV E/e' medial:  28.2 LVOT Area:     2.27 cm     LV e' lateral:   11.40 cm/s  LV E/e' lateral: 10.2  LV Volumes (MOD) LV vol d, MOD A2C: 92.0 ml LV vol d, MOD A4C: 104.0 ml LV vol s, MOD A2C: 57.5 ml LV vol s, MOD A4C: 68.8 ml LV SV MOD A2C:     34.5 ml LV SV MOD A4C:     104.0 ml LV SV MOD BP:      37.3 ml RIGHT VENTRICLE RV Basal diam:  2.94 cm LEFT ATRIUM             Index        RIGHT ATRIUM          Index LA Vol (A2C):   67.1 ml 34.30 ml/m  RA Area:     8.90 cm LA Vol (A4C):   69.2 ml 35.37 ml/m  RA Volume:   12.70 ml 6.49 ml/m LA Biplane Vol: 71.0 ml 36.29 ml/m  AORTIC VALVE                     PULMONIC VALVE AV Area (Vmax):    1.49 cm      PV Vmax:       0.93 m/s AV Area (Vmean):   1.39 cm      PV Vmean:      63.900 cm/s AV Area (VTI):     1.63 cm      PV VTI:        0.192 m AV Vmax:           146.00 cm/s   PV Peak grad:  3.4 mmHg AV Vmean:          111.000 cm/s  PV Mean grad:  2.0 mmHg AV VTI:            0.275 m AV Peak Grad:      8.5 mmHg AV Mean Grad:      5.0 mmHg LVOT Vmax:         96.00 cm/s LVOT Vmean:        68.000 cm/s LVOT VTI:          0.198 m LVOT/AV VTI ratio: 0.72  AORTA Ao Root diam: 2.60 cm MITRAL VALVE MV Area (PHT): 5.09 cm     SHUNTS MV Area VTI:   2.42 cm     Systemic VTI:  0.20 m MV Peak grad:  6.6 mmHg     Systemic Diam: 1.70 cm MV Mean grad:  3.0 mmHg MV Vmax:       1.28 m/s MV Vmean:      70.5 cm/s MV Decel Time: 149 msec MV E velocity: 116.33 cm/s Sena Slate Electronically signed by Sena Slate Signature Date/Time: 08/14/2021/5:21:01 PM    Final         Scheduled Meds:  carvedilol  6.25 mg Oral BID WC   enoxaparin (LOVENOX) injection  40 mg Subcutaneous Q24H   furosemide  20 mg Oral Daily   latanoprost  1 drop Both Eyes QHS   potassium chloride  40 mEq Oral BID    predniSONE  40 mg Oral Q breakfast   sacubitril-valsartan  1 tablet Oral BID   spironolactone  12.5 mg Oral Daily   timolol  1 drop Left Eye Daily   Continuous Infusions:   LOS: 2 days    Time spent: 25 mins     Charise Killian, MD [facility] Pager [phone]  If 7PM-7AM, please contact night-coverage 08/16/2021, 7:34 AM

## 2021-08-16 NOTE — Progress Notes (Signed)
Physical Therapy Treatment ?Patient Details ?Name: Diana Garrett ?MRN: [id number] ?DOB: March 11, 1930 ?Today's Date: 08/16/2021 ? ? ?History of Present Illness Charisa Norfleet is an 40yoF who comes to [facility] with difficulty breathing now admitted with CHF. ? ?  ?PT Comments  ? ? Pt asleep in bed on arrival, son at bedside. Both report pt has been up to recliner this morning, AMB to BR twice feeling close to baseline in strength, breathing, and balance. Pt reports her breathing is better today. Despite trying to rest and being awoken for session, pt agreeable to take orthostatic vitals- values flat without any symptoms or hypotension. Pt nearing baseline, family helping with maintaining activity OOB to chair for meals and AMB to BR for toiletting needs. Will sign off at this time. Pt would still benefit from HHPT at [place].  ?  ?Recommendations for follow up therapy are one component of a multi-disciplinary discharge planning process, led by the attending physician.  Recommendations may be updated based on patient status, additional functional criteria and insurance authorization. ? ?Follow Up Recommendations ? Home health PT ?  ?  ?Assistance Recommended at Discharge Set up Supervision/Assistance  ?Patient can return home with the following A little help with walking and/or transfers;Assistance with cooking/housework;Direct supervision/assist for medications management;Direct supervision/assist for financial management;Assist for transportation;Help with stairs or ramp for entrance ?  ?Equipment Recommendations ? None recommended by PT  ?  ?Recommendations for Other Services   ? ? ?  ?Precautions / Restrictions Precautions ?Precautions: Fall ?Restrictions ?Weight Bearing Restrictions: No  ?  ? ?Mobility ? Bed Mobility ?Overal bed mobility: Modified Independent ?  ?  ?  ?  ?  ?  ?  ?  ? ?Transfers ?Overall transfer level: Needs assistance ?Equipment used: 1 person hand held assist ?Transfers: Sit to/from Stand ?Sit to Stand:  Supervision ?  ?  ?  ?  ?  ?  ?  ? ?Ambulation/Gait ?Ambulation/Gait assistance:  (declines AMB with author; per pt adn son, pt AMB in room twice earlier for ADL in BR) ?  ?  ?  ?  ?  ?  ?  ? ? ?Stairs ?  ?  ?  ?  ?  ? ? ?Wheelchair Mobility ?  ? ?Modified Rankin (Stroke Patients Only) ?  ? ? ?  ?Balance Overall balance assessment: Modified Independent ?  ?  ?  ?  ?  ?  ?  ?  ?  ?  ?  ?  ?  ?  ?  ?  ?  ?  ?  ? ?  ?Cognition Arousal/Alertness: Awake/alert ?Behavior During Therapy: [facility] for tasks assessed/performed ?Overall Cognitive Status: Within Functional Limits for tasks assessed ?  ?  ?  ?  ?  ?  ?  ?  ?  ?  ?  ?  ?  ?  ?  ?  ?  ?  ?  ? ?  ?Exercises   ? ?  ?General Comments   ?  ?  ? ?Pertinent Vitals/Pain Pain Assessment ?Pain Assessment: No/denies pain  ? ? ?Home Living   ?  ?  ?  ?  ?  ?  ?  ?  ?  ?   ?  ?Prior Function    ?  ?  ?   ? ?PT Goals (current goals can now be found in the care plan section) Acute Rehab PT Goals ?Patient Stated Goal: regain breathing ability with AMB ?PT Goal Formulation: With patient ?Time  For Goal Achievement: 08/29/21 ?Potential to Achieve Goals: Good ?Progress towards PT goals: Progressing toward goals;Goals met/education completed, patient discharged from PT ? ?  ?Frequency ? ? ? Min 2X/week ? ? ? ?  ?PT Plan Current plan remains appropriate  ? ? ?Co-evaluation   ?  ?  ?  ?  ? ?  ?AM-PAC PT "6 Clicks" Mobility   ?Outcome Measure ? Help needed turning from your back to your side while in a flat bed without using bedrails?: None ?Help needed moving from lying on your back to sitting on the side of a flat bed without using bedrails?: None ?Help needed moving to and from a bed to a chair (including a wheelchair)?: A Little ?Help needed standing up from a chair using your arms (e.g., wheelchair or bedside chair)?: A Little ?Help needed to walk in hospital room?: None ?Help needed climbing 3-5 steps with a railing? : A Little ?6 Click Score: 21 ? ?  ?End of Session   ?Activity  Tolerance: Patient tolerated treatment well;No increased pain ?Patient left: in bed;with family/visitor present;with call bell/phone within reach ?Nurse Communication: Mobility status ?PT Visit Diagnosis: Difficulty in walking, not elsewhere classified (R26.2);Dizziness and giddiness (R42);Other abnormalities of gait and mobility (R26.89) ?  ? ? ?Time: 9570-2202 ?PT Time Calculation (min) (ACUTE ONLY): 12 min ? ?Charges:  $Therapeutic Activity: 8-22 mins          ?          ?3:03 PM, 08/16/21 ?Etta Grandchild, PT, DPT ?Physical Therapist - [place] ?[facility]  ?[phone] (ASCOM)  ? ? ? ?Kayleen Alig C ?08/16/2021, 3:00 PM ? ?

## 2021-08-16 NOTE — Progress Notes (Signed)
PIV removed. Discharge instructions completed. Patient verbalized understanding of medication regimen, follow up appointments and discharge instructions. Patient belongings gathered and packed to discharge. Daughter in law will take home. ? ? ?

## 2021-08-16 NOTE — TOC Transition Note (Signed)
Transition of Care (TOC) - CM/SW Discharge Note ? ? ?Patient Details  ?Name: Diana Garrett ?MRN: [id number] ?Date of Birth: 1929-11-02 ? ?Transition of Care (TOC) CM/SW Contact:  ?Candie Chroman, LCSW ?Phone Number: ?08/16/2021, 4:23 PM ? ? ?Clinical Narrative: Patient has orders to discharge home today. Spoke with her and daughter-in-law about home health. They are agreeable to try it out. No agency preference. [facility] up with [facility] Start date 08/19/21 which patient preferred so she can rest over the weekend. No further concerns. CSW signing off.   ? ?Final next level of care: [place] ?Barriers to Discharge: Barriers Resolved ? ? ?Patient Goals and CMS Choice ?  ?CMS Medicare.gov Compare Post Acute Care list provided to:: Patient (Daughter-in-law) ?Choice offered to / list presented to : Patient, Adult Children (Daughter-in-law) ? ?Discharge Placement ?  ?           ?  ?Patient to be transferred to facility by: Daughter-in-law ?Name of family member notified: Alija Cantrelle [phone]) ?Patient and family notified of of transfer: 08/16/21 ? ?Discharge Plan and Services ?  ?  ?Post Acute Care Choice:  (TBD)          ?  ?  ?  ?  ?  ?HH Arranged: RN, PT, OT ?[place] Agency: [facility] ([place]) ?Date HH Agency Contacted: 08/16/21 ?  ?Representative spoke with at [facility]: Floydene Flock ? ?Social Determinants of Health (SDOH) Interventions ?  ? ? ?Readmission Risk Interventions ?No flowsheet data found. ? ? ? ? ?

## 2021-08-16 NOTE — Care Management Important Message (Signed)
Important Message ? ?Patient Details  ?Name: Diana Garrett ?MRN: [id number] ?Date of Birth: Jun 26, 1929 ? ? ?Medicare Important Message Given:  N/A - LOS <3 / Initial given by admissions ? ? ? ? ?[name] ?08/16/2021, 8:46 AM ?

## 2021-08-16 NOTE — Discharge Summary (Signed)
Physician Discharge Summary  Marvine Encalade UJW:[id number] DOB: 11/11/1929 DOA: 08/14/2021  PCP: Cleon Dew, FNP  Admit date: 08/14/2021 Discharge date: 08/16/2021  Admitted From: home  Disposition:  home  Recommendations for Outpatient Follow-up:  Follow up with PCP in 1-2 weeks F/u w/ cardio, Dr. Mariah Milling, in 1 week   Home Health: Equipment/Devices:  Discharge Condition: stable  CODE STATUS: full  Diet recommendation: Heart Healthy / Carb Modified / Regular / Dysphagia   Brief/Interim Summary: HPI was taken from Dr. Clyde Lundborg: Diana Garrett is a 88 y.o. female with medical history significant of sCHF with EF 45-50%), HTN, glaucoma, who presents with shortness of breath.   Per patient and her son at the bedside, patient started having shortness of breath since this morning, which has been progressively worsening.  Patient has cough with little clear mucus production.  Denies chest pain, fever or chills.  Patient was found to have oxygen desaturating to 80s on room air, 4L of oxygen is started in ED.  Patient is not wearing oxygen normally at home.  Patient denies nausea, vomiting, diarrhea or abdominal pain.  No symptoms of UTI.   Data Reviewed and ED Course: pt was found to have BNP 326, troponin level 10, negative COVID PCR, electrolytes renal function okay, temperature 97.5, blood pressure 156/88, heart rate 91, RR 28, oxygen saturation 92-93% on 4 L oxygen.  Chest x-ray showed cardiomegaly and vascular congestion.  Patient is admitted to progressive bed as inpatient.     EKG: I have personally reviewed.  Sinus rhythm, QTc 492, left atrial enlargement, poor R wave progression, T wave inversion in lead I/aVL.   Hospital course as per Dr. Mayford Knife 3/9-3/10/23: Pt presented w/ shortness of breath likely secondary to CHF exacerbation. Pt was treated w/ lasix, coreg, entresto and pt was started on aldactone as per cardio. Pt will f/u w/ cardio outpatient in 1 week. Of note, there was concern  of possible bronchitis as well so pt was placed on po azithromyin & po steroids. PT/OT evaluated the pt and recommend home health. Pt was unsure of whether or not she wanted home health.   Discharge Diagnoses:  Principal Problem:   Acute respiratory failure with hypoxia (HCC) Active Problems:   HTN (hypertension)   Leukocytosis   Chronic CHF (congestive heart failure) (HCC)   Viral bronchitis   Dilated cardiomyopathy (HCC)  Acute hypoxic respiratory failure:  EMS stated 85% on RA. Likely secondary to acute on chronic CHF exacerbation. Weaned off of supplemental oxygen today    Acute on chronic combined CHF: echo EF 45-50% w/ grade II diastolic dysfunction & global hypokinesis. Continue on lasix, coreg, entresto & started on aldactone as per cardio. Monitor I/Os and daily weights. Cardio is following and recs    Possible bronchitis: continue on azithromycin, steroids & bronchodilators    Hypokalemia: KCl repleated   HTN: continue on entresto, coreg. IV hydralazine    Leukocytosis: resolved    Obesity: BMI 30.3. Complicates overall care & prognosis     Discharge Instructions  Discharge Instructions     Diet - low sodium heart healthy   Complete by: As directed    Discharge instructions   Complete by: As directed    F/u w/ cardio, Dr. Mariah Milling, in 1 week. F/u w/ PCP in 1-2 weeks   Increase activity slowly   Complete by: As directed       Allergies as of 08/16/2021       Reactions   Aspirin  Iodine    Mercury    Tylenol [acetaminophen]         Medication List     STOP taking these medications    amoxicillin-clavulanate 875-125 MG tablet Commonly known as: AUGMENTIN       TAKE these medications    AeroChamber MV inhaler Use as instructed   albuterol 108 (90 Base) MCG/ACT inhaler Commonly known as: VENTOLIN HFA Inhale 2 puffs into the lungs every 4 (four) hours as needed.   azithromycin 250 MG tablet Commonly known as: ZITHROMAX Take 1 tablet (250 mg  total) by mouth daily for 4 days. What changed: additional instructions   carvedilol 6.25 MG tablet Commonly known as: COREG Take 6.25 mg by mouth 2 (two) times daily with a meal.   Entresto 49-51 MG Generic drug: sacubitril-valsartan Take 1 tablet by mouth 2 (two) times daily.   furosemide 20 MG tablet Commonly known as: LASIX Take 20 mg by mouth.   latanoprost 0.005 % ophthalmic solution Commonly known as: XALATAN Place 1 drop into both eyes at bedtime.   predniSONE 20 MG tablet Commonly known as: DELTASONE Take 2 tablets (40 mg total) by mouth daily with breakfast for 4 days. Start taking on: August 17, 2021 What changed:  medication strength how much to take how to take this when to take this additional instructions   spironolactone 25 MG tablet Commonly known as: ALDACTONE Take 0.5 tablets (12.5 mg total) by mouth daily. Start taking on: August 17, 2021   timolol 0.5 % ophthalmic solution Commonly known as: TIMOPTIC Place 1 drop into the left eye daily.        Follow-up Information     Cleon DewMulholland, Andrea, FNP .   Specialty: Family Medicine Contact information: [address] [place] [address] [phone]         Antonieta IbaGollan, Timothy J, MD .   Specialty: Cardiology Contact information: [address] [place] [address] [phone]                Allergies  Allergen Reactions   Aspirin    Iodine    Mercury    Tylenol [Acetaminophen]     Consultations: Cardio    Procedures/Studies: DG Chest Portable 1 View  Result Date: 08/14/2021 CLINICAL DATA:  Shortness of breath and cough EXAM: PORTABLE CHEST 1 VIEW COMPARISON:  06/07/2021 FINDINGS: Sizable hiatal hernia with similar degree of gas distension. Borderline heart size with vascular pedicle widening, significantly limited by rightward rotation. Symmetric generalized interstitial prominence. Trace pleural effusion is possible. No pneumothorax. IMPRESSION: Cardiomegaly  and mild vascular congestion. Electronically Signed   By: Tiburcio PeaJonathan  Watts M.D.   On: 08/14/2021 07:31   ECHOCARDIOGRAM COMPLETE  Result Date: 08/14/2021    ECHOCARDIOGRAM REPORT   Patient Name:   Maree KrabbeRAMONA Jurney Date of Exam: 08/14/2021 Medical Rec #:  [id number]     Height:       64.0 in Accession #:    [id number]    Weight:       200.0 lb Date of Birth:  01/06/1930     BSA:          1.956 m Patient Age:    89 years      BP:           125/71 mmHg Patient Gender: F             HR:           78 bpm. Exam Location:  [facility] Procedure:  2D Echo, Color Doppler and Cardiac Doppler Indications:     I50.21 congestive heart failure-Acute Systolic  History:         Patient has no prior history of Echocardiogram examinations.                  Risk Factors:Hypertension.  Sonographer:     Humphrey Rolls Referring Phys:  Wynona Neat NIU Diagnosing Phys: Sena Slate  Sonographer Comments: No parasternal window and suboptimal subcostal window. Image acquisition challenging due to patient body habitus. IMPRESSIONS  1. Left ventricular ejection fraction, by estimation, is 40 to 45%. The left ventricle has mildly decreased function. The left ventricle demonstrates global hypokinesis. Left ventricular diastolic parameters are consistent with Grade II diastolic dysfunction (pseudonormalization).  2. Right ventricular systolic function is normal. The right ventricular size is normal.  3. Left atrial size was mildly dilated.  4. The mitral valve is grossly normal. Mild mitral valve regurgitation. No evidence of mitral stenosis.  5. The aortic valve was not well visualized. Aortic valve regurgitation is not visualized. No aortic stenosis is present. Conclusion(s)/Recommendation(s): TECHNICALLY DIFFICULT STUDY D/T CHEST WALL AND LUNG INTERFERENCE. FINDINGS  Left Ventricle: Left ventricular ejection fraction, by estimation, is 40 to 45%. The left ventricle has mildly decreased function. The left ventricle demonstrates global hypokinesis. Left  ventricular diastolic parameters are consistent with Grade II diastolic dysfunction (pseudonormalization). Right Ventricle: The right ventricular size is normal. Right vetricular wall thickness was not well visualized. Right ventricular systolic function is normal. Left Atrium: Left atrial size was mildly dilated. Right Atrium: Right atrial size was normal in size. Pericardium: There is no evidence of pericardial effusion. Mitral Valve: The mitral valve is grossly normal. Mild mitral valve regurgitation. No evidence of mitral valve stenosis. MV peak gradient, 6.6 mmHg. The mean mitral valve gradient is 3.0 mmHg. Tricuspid Valve: The tricuspid valve is not well visualized. Tricuspid valve regurgitation is mild. Aortic Valve: The aortic valve was not well visualized. Aortic valve regurgitation is not visualized. No aortic stenosis is present. Aortic valve mean gradient measures 5.0 mmHg. Aortic valve peak gradient measures 8.5 mmHg. Aortic valve area, by VTI measures 1.63 cm. Pulmonic Valve: The pulmonic valve was not well visualized. Aorta: The aortic root was not well visualized. Venous: The inferior vena cava was not well visualized. IAS/Shunts: The interatrial septum was not well visualized.  LEFT VENTRICLE PLAX 2D LVOT diam:     1.70 cm      Diastology LV SV:         45           LV e' medial:    4.13 cm/s LV SV Index:   23           LV E/e' medial:  28.2 LVOT Area:     2.27 cm     LV e' lateral:   11.40 cm/s                             LV E/e' lateral: 10.2  LV Volumes (MOD) LV vol d, MOD A2C: 92.0 ml LV vol d, MOD A4C: 104.0 ml LV vol s, MOD A2C: 57.5 ml LV vol s, MOD A4C: 68.8 ml LV SV MOD A2C:     34.5 ml LV SV MOD A4C:     104.0 ml LV SV MOD BP:      37.3 ml RIGHT VENTRICLE RV Basal diam:  2.94 cm LEFT ATRIUM  Index        RIGHT ATRIUM          Index LA Vol (A2C):   67.1 ml 34.30 ml/m  RA Area:     8.90 cm LA Vol (A4C):   69.2 ml 35.37 ml/m  RA Volume:   12.70 ml 6.49 ml/m LA Biplane Vol:  71.0 ml 36.29 ml/m  AORTIC VALVE                     PULMONIC VALVE AV Area (Vmax):    1.49 cm      PV Vmax:       0.93 m/s AV Area (Vmean):   1.39 cm      PV Vmean:      63.900 cm/s AV Area (VTI):     1.63 cm      PV VTI:        0.192 m AV Vmax:           146.00 cm/s   PV Peak grad:  3.4 mmHg AV Vmean:          111.000 cm/s  PV Mean grad:  2.0 mmHg AV VTI:            0.275 m AV Peak Grad:      8.5 mmHg AV Mean Grad:      5.0 mmHg LVOT Vmax:         96.00 cm/s LVOT Vmean:        68.000 cm/s LVOT VTI:          0.198 m LVOT/AV VTI ratio: 0.72  AORTA Ao Root diam: 2.60 cm MITRAL VALVE MV Area (PHT): 5.09 cm     SHUNTS MV Area VTI:   2.42 cm     Systemic VTI:  0.20 m MV Peak grad:  6.6 mmHg     Systemic Diam: 1.70 cm MV Mean grad:  3.0 mmHg MV Vmax:       1.28 m/s MV Vmean:      70.5 cm/s MV Decel Time: 149 msec MV E velocity: 116.33 cm/s Sena Slate Electronically signed by Sena Slate Signature Date/Time: 08/14/2021/5:21:01 PM    Final    (Echo, Carotid, EGD, Colonoscopy, ERCP)    Subjective: Pt c/o cough & wheezing    Discharge Exam: Vitals:   08/16/21 1208 08/16/21 1456  BP: 140/65 (!) 152/81  Pulse: 68 68  Resp: 18 18  Temp: 98 F (36.7 C) 98.1 F (36.7 C)  SpO2: 97% 96%   Vitals:   08/16/21 0428 08/16/21 0723 08/16/21 1208 08/16/21 1456  BP: 136/79 140/67 140/65 (!) 152/81  Pulse: 66 71 68 68  Resp: Temp: 97.7 F (36.5 C) 98 F (36.7 C) 98 F (36.7 C) 98.1 F (36.7 C)  TempSrc:  Oral    SpO2: 96% 95% 97% 96%  Weight: 80.1 kg      PE was taken from progress note on 08/16/21:   General exam: Appears calm but uncomfortable  Respiratory system: decreased breath sounds b/l. No rales  Cardiovascular system: S1/S2+. No rubs or clicks  Gastrointestinal system: Abd is soft, NT, obese & hypoactive bowel sounds  Central nervous system: alert and oriented. Moves all extremities  Psychiatry: Judgement and insight appears normal. Flat mood and affect.    The results of  significant diagnostics from this hospitalization (including imaging, microbiology, ancillary and laboratory) are listed below for reference.     Microbiology: Recent Results (from the past  240 hour(s))  Resp Panel by RT-PCR (Flu A&B, Covid) Nasopharyngeal Swab     Status: None   Collection Time: 08/14/21  7:01 AM   Specimen: Nasopharyngeal Swab; Nasopharyngeal(NP) swabs in vial transport medium  Result Value Ref Range Status   SARS Coronavirus 2 by RT PCR NEGATIVE NEGATIVE Final    Comment: (NOTE) SARS-CoV-2 target nucleic acids are NOT DETECTED.  The SARS-CoV-2 RNA is generally detectable in upper respiratory specimens during the acute phase of infection. The lowest concentration of SARS-CoV-2 viral copies this assay can detect is 138 copies/mL. A negative result does not preclude SARS-Cov-2 infection and should not be used as the sole basis for treatment or other patient management decisions. A negative result may occur with  improper specimen collection/handling, submission of specimen other than nasopharyngeal swab, presence of viral mutation(s) within the areas targeted by this assay, and inadequate number of viral copies(<138 copies/mL). A negative result must be combined with clinical observations, patient history, and epidemiological information. The expected result is Negative.  Fact Sheet for Patients:  BloggerCourse.com  Fact Sheet for Healthcare Providers:  SeriousBroker.it  This test is no t yet approved or cleared by the [place] FDA and  has been authorized for detection and/or diagnosis of SARS-CoV-2 by FDA under an Emergency Use Authorization (EUA). This EUA will remain  in effect (meaning this test can be used) for the duration of the COVID-19 declaration under Section 564(b)(1) of the Act, 21 U.S.C.section 360bbb-3(b)(1), unless the authorization is terminated  or revoked sooner.       Influenza A by  PCR NEGATIVE NEGATIVE Final   Influenza B by PCR NEGATIVE NEGATIVE Final    Comment: (NOTE) The Xpert Xpress SARS-CoV-2/FLU/RSV plus assay is intended as an aid in the diagnosis of influenza from Nasopharyngeal swab specimens and should not be used as a sole basis for treatment. Nasal washings and aspirates are unacceptable for Xpert Xpress SARS-CoV-2/FLU/RSV testing.  Fact Sheet for Patients: BloggerCourse.com  Fact Sheet for Healthcare Providers: SeriousBroker.it  This test is not yet approved or cleared by the [place] FDA and has been authorized for detection and/or diagnosis of SARS-CoV-2 by FDA under an Emergency Use Authorization (EUA). This EUA will remain in effect (meaning this test can be used) for the duration of the COVID-19 declaration under Section 564(b)(1) of the Act, 21 U.S.C. section 360bbb-3(b)(1), unless the authorization is terminated or revoked.  Performed at [facility], [address] [place], [place] [address]      Labs: BNP (last 3 results) Recent Labs    08/14/21 0701  BNP 326.8*   Basic Metabolic Panel: Recent Labs  Lab 08/14/21 0701 08/15/21 0456 08/16/21 0523  NA 141 138 139  K 3.9 3.5 3.0*  CL 106 99 98  CO2 25 29 32  GLUCOSE 169* 111* 103*  BUN 19 19 33*  CREATININE 0.55 0.69 0.65  CALCIUM 9.0 8.7* 8.9  MG  --  1.9  --    Liver Function Tests: Recent Labs  Lab 08/14/21 0701  AST 31  ALT 10  ALKPHOS 96  BILITOT 0.8  PROT 6.9  ALBUMIN 3.7   No results for input(s): LIPASE, AMYLASE in the last 168 hours. No results for input(s): AMMONIA in the last 168 hours. CBC: Recent Labs  Lab 08/14/21 0701 08/15/21 0456  WBC 13.9* 8.0  HGB 14.1 14.1  HCT 45.8 44.3  MCV 96.8 93.1  PLT 207 182   Cardiac Enzymes: No results for input(s): CKTOTAL,  CKMB, CKMBINDEX, TROPONINI in the last 168 hours. BNP: Invalid input(s): POCBNP CBG: No results for input(s):  GLUCAP in the last 168 hours. D-Dimer No results for input(s): DDIMER in the last 72 hours. Hgb A1c No results for input(s): HGBA1C in the last 72 hours. Lipid Profile No results for input(s): CHOL, HDL, LDLCALC, TRIG, CHOLHDL, LDLDIRECT in the last 72 hours. Thyroid function studies No results for input(s): TSH, T4TOTAL, T3FREE, THYROIDAB in the last 72 hours.  Invalid input(s): FREET3 Anemia work up No results for input(s): VITAMINB12, FOLATE, FERRITIN, TIBC, IRON, RETICCTPCT in the last 72 hours. Urinalysis No results found for: COLORURINE, APPEARANCEUR, LABSPEC, PHURINE, GLUCOSEU, HGBUR, BILIRUBINUR, KETONESUR, PROTEINUR, UROBILINOGEN, NITRITE, LEUKOCYTESUR Sepsis Labs Invalid input(s): PROCALCITONIN,  WBC,  LACTICIDVEN Microbiology Recent Results (from the past 240 hour(s))  Resp Panel by RT-PCR (Flu A&B, Covid) Nasopharyngeal Swab     Status: None   Collection Time: 08/14/21  7:01 AM   Specimen: Nasopharyngeal Swab; Nasopharyngeal(NP) swabs in vial transport medium  Result Value Ref Range Status   SARS Coronavirus 2 by RT PCR NEGATIVE NEGATIVE Final    Comment: (NOTE) SARS-CoV-2 target nucleic acids are NOT DETECTED.  The SARS-CoV-2 RNA is generally detectable in upper respiratory specimens during the acute phase of infection. The lowest concentration of SARS-CoV-2 viral copies this assay can detect is 138 copies/mL. A negative result does not preclude SARS-Cov-2 infection and should not be used as the sole basis for treatment or other patient management decisions. A negative result may occur with  improper specimen collection/handling, submission of specimen other than nasopharyngeal swab, presence of viral mutation(s) within the areas targeted by this assay, and inadequate number of viral copies(<138 copies/mL). A negative result must be combined with clinical observations, patient history, and epidemiological information. The expected result is Negative.  Fact Sheet  for Patients:  BloggerCourse.com  Fact Sheet for Healthcare Providers:  SeriousBroker.it  This test is no t yet approved or cleared by the [place] FDA and  has been authorized for detection and/or diagnosis of SARS-CoV-2 by FDA under an Emergency Use Authorization (EUA). This EUA will remain  in effect (meaning this test can be used) for the duration of the COVID-19 declaration under Section 564(b)(1) of the Act, 21 U.S.C.section 360bbb-3(b)(1), unless the authorization is terminated  or revoked sooner.       Influenza A by PCR NEGATIVE NEGATIVE Final   Influenza B by PCR NEGATIVE NEGATIVE Final    Comment: (NOTE) The Xpert Xpress SARS-CoV-2/FLU/RSV plus assay is intended as an aid in the diagnosis of influenza from Nasopharyngeal swab specimens and should not be used as a sole basis for treatment. Nasal washings and aspirates are unacceptable for Xpert Xpress SARS-CoV-2/FLU/RSV testing.  Fact Sheet for Patients: BloggerCourse.com  Fact Sheet for Healthcare Providers: SeriousBroker.it  This test is not yet approved or cleared by the [place] FDA and has been authorized for detection and/or diagnosis of SARS-CoV-2 by FDA under an Emergency Use Authorization (EUA). This EUA will remain in effect (meaning this test can be used) for the duration of the COVID-19 declaration under Section 564(b)(1) of the Act, 21 U.S.C. section 360bbb-3(b)(1), unless the authorization is terminated or revoked.  Performed at [facility], [address], [place], [place] [address]      Time coordinating discharge: Over 30 minutes  SIGNED:   Charise Killian, MD  [facility] 08/16/2021, 4:02 PM Pager   If 7PM-7AM, please contact night-coverage

## 2021-08-16 NOTE — Progress Notes (Signed)
? ?  Heart Failure Nurse Navigator Note ? ?Met with patient who was lying in bed in no acute distress and son who was sitting at the bedside. ? ?Went over how she is going to take care of herself at home, daily weights fluid restriction and sodium restriction along with reporting weight gains of 2 to 3 pounds overnight or 5 pounds within the week. ? ?Son states that his mom gets very frustrated with having her sleep erupted with having to get up at night to urinate.  She takes her first dose of Lasix at 6 in the morning and takes her second dose at 6 PM.  Recommended continuing with taking her first dose at 6 AM and moving the second dose to approximately 2 PM in the afternoon.  He voices understanding. ? ?Reinforced follow-up in the outpatient heart failure clinic. ? ?They had no further questions. ? ?Pricilla Riffle RN [facility] ?

## 2021-08-27 NOTE — Progress Notes (Signed)
? Patient ID: Diana Garrett, female    DOB: December 21, 1929, 88 y.o.   MRN: [id number] ? ?HPI ? ?Offered interpreter but patient declined ? ? ?Diana Garrett is a 88 y/o female with a history of HTN, anxiety, glaucoma, obesity and chronic heart failure.  ? ?Echo report from 08/14/21 reviewed and showed an EF of 40-45% along with mild LAE and mild MR.  ? ?Admitted 08/14/21 due to worsening SOB and cough. Cardiology consult obtained. Weaned off of supplemental oxygen. Given antibiotics/ steroids for possible bronchitis. PT/OT evaluation done. Hypokalemia corrected. Discharged after 2 days.  ? ?She presents today for her initial visit with a chief complaint of minimal shortness of breath upon moderate exertion. She describes this as chronic and tends to be associated with her anxiety. 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 She denies any difficulty sleeping, abdominal distention, palpitations, pedal edema, chest pain or weight gain.  ? ?Recent admission was brought on by patient not taking her furosemide but she's been taking it since discharge. Did not take her spironolactone yesterday because she thought it was making her feel bad but she did take it today. ? ?Past Medical History:  ?Diagnosis Date  ? Anxiety   ? Chronic HFmrEF   ? a. 09/2019 EF 45-50%; b. 08/2021 Echo: EF 40-45%, glob HK, GrII, DD, nl RV fxn, mildly dil LA, mild MR.  ? Glaucoma   ? History of stress test   ? a. Reports normal stress test in [place], [place] ~ 2017.  ? HTN (hypertension)   ? Morbid obesity (HCC)   ? ?Past Surgical History:  ?Procedure Laterality Date  ? CESAREAN SECTION    ? ?Family History  ?Problem Relation Age of Onset  ? Hypertension Father   ? Heart disease Father   ? ?Social History  ? ?Tobacco Use  ? Smoking status: Never  ? Smokeless tobacco: Never  ?Substance Use Topics  ? Alcohol use: Yes  ?  Comment: rare  ? ?Allergies  ?Allergen Reactions  ?  Aspirin   ? Iodine   ? Mercury   ? Tylenol [Acetaminophen]   ? ?Prior to Admission medications   ?Medication Sig Start Date End Date Taking? Authorizing Provider  ?carvedilol (COREG) 6.25 MG tablet Take 6.25 mg by mouth 2 (two) times daily with a meal.   Yes [provider]  ?furosemide (LASIX) 20 MG tablet Take 20 mg by mouth daily.   Yes [provider]  ?latanoprost (XALATAN) 0.005 % ophthalmic solution Place 1 drop into both eyes at bedtime. 08/03/21  Yes [provider]  ?sacubitril-valsartan (ENTRESTO) 49-51 MG Take 1 tablet by mouth 2 (two) times daily.   Yes [provider]  ?Spacer/Aero-Holding Chambers (AEROCHAMBER MV) inhaler Use as instructed 06/07/21  Yes Becky Augusta, NP  ?spironolactone (ALDACTONE) 25 MG tablet Take 0.5 tablets (12.5 mg total) by mouth daily. 08/17/21 09/16/21 Yes Charise Killian, MD  ?timolol (TIMOPTIC) 0.5 % ophthalmic solution Place 1 drop into the left eye daily. 08/12/21  Yes [provider]  ?albuterol (VENTOLIN HFA) 108 (90 Base) MCG/ACT inhaler Inhale 2 puffs into the lungs every 4 (four) hours as needed. ?Patient not taking: Reported on 08/29/2021 06/07/21   Becky Augusta, NP  ? ?Review of Systems  ?Constitutional:  Positive for fatigue. Negative for appetite change.  ?HENT:  Negative for congestion, postnasal drip and sore throat.   ?Eyes:  Positive for visual  disturbance (legally blind).  ?Respiratory:  Positive for cough (at times) and shortness of breath (when feeling anxious). Negative for chest tightness.   ?Cardiovascular:  Negative for chest pain, palpitations and leg swelling.  ?Gastrointestinal:  Negative for abdominal distention and abdominal pain.  ?Endocrine: Negative.   ?Genitourinary: Negative.   ?Musculoskeletal:  Negative for back pain and neck pain.  ?Skin: Negative.   ?Allergic/Immunologic: Negative.   ?Neurological:  Positive for dizziness (after diuretic).  ?Hematological:  Negative for adenopathy. Does not  bruise/bleed easily.  ?Psychiatric/Behavioral:  Negative for dysphoric mood and sleep disturbance (sleeping on 2 pillow). The patient is nervous/anxious.   ? ?Vitals:  ? 08/29/21 1339  ?BP: (!) 149/61  ?Pulse: 67  ?Resp: 18  ?SpO2: 100%  ?Weight: 181 lb (82.1 kg)  ?Height: 5\' 2"  (1.575 m)  ? ?Wt Readings from Last 3 Encounters:  ?08/29/21 181 lb (82.1 kg)  ?08/16/21 176 lb 9.4 oz (80.1 kg)  ?03/23/21 192 lb (87.1 kg)  ? ?Lab Results  ?Component Value Date  ? CREATININE 0.65 08/16/2021  ? CREATININE 0.69 08/15/2021  ? CREATININE 0.55 08/14/2021  ? ?Physical Exam ?Vitals and nursing note reviewed. Exam conducted with a chaperone present (son).  ?Constitutional:   ?   Appearance: Normal appearance.  ?HENT:  ?   Head: Normocephalic and atraumatic.  ?Cardiovascular:  ?   Rate and Rhythm: Normal rate and regular rhythm.  ?Pulmonary:  ?   Effort: Pulmonary effort is normal. No respiratory distress.  ?   Breath sounds: No wheezing or rales.  ?Abdominal:  ?   General: There is no distension.  ?   Palpations: Abdomen is soft.  ?Musculoskeletal:     ?   General: No tenderness.  ?   Cervical back: Normal range of motion and neck supple.  ?   Right lower leg: No edema.  ?   Left lower leg: No edema.  ?Skin: ?   General: Skin is warm and dry.  ?Neurological:  ?   General: No focal deficit present.  ?   Mental Status: She is alert and oriented to person, place, and time.  ?Psychiatric:     ?   Mood and Affect: Mood normal.     ?   Behavior: Behavior normal.     ?   Thought Content: Thought content normal.  ?  ?Assessment & Plan: ? ?1: Chronic heart failure with mildly reduced ejection fraction- ?- NYHA class II ?- euvolemic today ?- weighing daily and understands to call for an overnight weight gain of >2 pounds or a weekly weight gain of > 5 pounds ?- sees cardiology 10/14/2021) 09/17/21 ?- not adding salt and son is reading food labels for sodium content ?- drinks between 32-60 ounces of fluid daily ?- on GDMT of carvedilol,  entresto and spironolactone ?- consider SGLT2 ?- currently receiving home PT 2x/ week from [facility] ?- BNP 08/14/21 was 326.8 ? ?2: HTN- ?- BP mildly elevated (149/61) but she admits that she's anxious ?- saw PCP 10/14/21) 09/17/20 ?- BMP 08/16/21 reviewed and showed sodium 139, potassium 3.0, creatinine 0.65 and GFR >60 ?- recheck labs today due to hypokalemia ? ? ?Reviewed medications.  ? ?Return in 6 weeks, sooner if needed ? ?

## 2021-08-29 ENCOUNTER — Other Ambulatory Visit
Admission: RE | Admit: 2021-08-29 | Discharge: 2021-08-29 | Disposition: A | Discharge status: Home / Self Care | Payer: Medicare Other | Source: Ambulatory Visit | Attending: Family | Admitting: Family

## 2021-08-29 ENCOUNTER — Other Ambulatory Visit: Payer: Self-pay

## 2021-08-29 ENCOUNTER — Ambulatory Visit: Payer: Medicare Other | Admitting: Family

## 2021-08-29 ENCOUNTER — Encounter: Payer: Self-pay | Admitting: Family

## 2021-08-29 ENCOUNTER — Ambulatory Visit (HOSPITAL_BASED_OUTPATIENT_CLINIC_OR_DEPARTMENT_OTHER): Payer: Medicare Other | Admitting: Family

## 2021-08-29 VITALS — BP 149/61 | HR 67 | Resp 18 | Ht 62.0 in | Wt 181.0 lb

## 2021-08-29 DIAGNOSIS — R059 Cough, unspecified: Secondary | ICD-10-CM | POA: Diagnosis not present

## 2021-08-29 DIAGNOSIS — I110 Hypertensive heart disease with heart failure: Secondary | ICD-10-CM | POA: Diagnosis not present

## 2021-08-29 DIAGNOSIS — I10 Essential (primary) hypertension: Secondary | ICD-10-CM | POA: Diagnosis not present

## 2021-08-29 DIAGNOSIS — H409 Unspecified glaucoma: Secondary | ICD-10-CM | POA: Diagnosis not present

## 2021-08-29 DIAGNOSIS — H5440 Blindness, one eye, unspecified eye: Secondary | ICD-10-CM | POA: Insufficient documentation

## 2021-08-29 DIAGNOSIS — I340 Nonrheumatic mitral (valve) insufficiency: Secondary | ICD-10-CM | POA: Diagnosis not present

## 2021-08-29 DIAGNOSIS — Z79899 Other long term (current) drug therapy: Secondary | ICD-10-CM | POA: Diagnosis not present

## 2021-08-29 DIAGNOSIS — F419 Anxiety disorder, unspecified: Secondary | ICD-10-CM | POA: Insufficient documentation

## 2021-08-29 DIAGNOSIS — E876 Hypokalemia: Secondary | ICD-10-CM | POA: Insufficient documentation

## 2021-08-29 DIAGNOSIS — I5022 Chronic systolic (congestive) heart failure: Secondary | ICD-10-CM | POA: Insufficient documentation

## 2021-08-29 LAB — BASIC METABOLIC PANEL
Anion gap: 10 (ref 5–15)
BUN: 21 mg/dL (ref 8–23)
CO2: 30 mmol/L (ref 22–32)
Calcium: 9 mg/dL (ref 8.9–10.3)
Chloride: 100 mmol/L (ref 98–111)
Creatinine, Ser: 0.79 mg/dL (ref 0.44–1.00)
GFR, Estimated: >60 mL/min (ref >60)
Glucose, Bld: 112 mg/dL — ABNORMAL HIGH (ref 70–99)
Potassium: 4.5 mmol/L (ref 3.5–5.1)
Sodium: 140 mmol/L (ref 135–145)

## 2021-08-29 NOTE — Patient Instructions (Signed)
Continue weighing daily and call for an overnight weight gain of 3 pounds or more or a weekly weight gain of more than 5 pounds.   If you have voicemail, please make sure your mailbox is cleaned out so that we may leave a message and please make sure to listen to any voicemails.     

## 2021-08-30 ENCOUNTER — Encounter: Payer: Self-pay | Admitting: Family

## 2021-09-16 NOTE — Progress Notes (Signed)
Cardiology Office Note ? ?Date:  09/17/2021  ? ?ID:  Diana Garrett, DOB 1929/10/23, MRN [id number] ? ?PCP:  Cleon Dew, FNP  ? ?Chief Complaint  ?Patient presents with  ? [facility] follow up   ?  "Doing well." Medications reviewed by the patient verbally.   ? ? ?HPI:  ?Ms. Diana Garrett is a 88 year old woman with past medical history of  ?morbid obesity,  ?cardiomyopathy,  ?chronic diastolic/systolic CHF ejection fraction 45%,  ?hypertension, ?presented to the ED hospital August 14, 2021 with shortness of breath/CHF, viral bronchitis ?Who presents for routine follow-up in the office after recent hospitalization ? ?Notes reviewed, ?Moved in the past year  from [place] to live with family locally,  ?diagnosed with cardiomyopathy ejection fraction 45 to 50% April 2021, no prior ischemic work-up performed. ?As outpatient has been managed with carvedilol, Entresto, Lasix 20 daily with medication noncompliance with her Lasix, dietary noncompliance. ?  ?Prior to hospital admission she had sick contacts, daughter with viral illness, ?presenting to the hospital with malaise, coughing, wheezing. ?She denies lower extremity edema, no abdominal distention, BNP 326 ?Chest x-ray cardiomegaly, vascular congestion ?Given dose IV Lasix ?Continued wheezing this morning, cough, nonproductive though worsening chest congestion per daughter at the bedside ? ?Echocardiogram in the hospital ejection fraction 40 to 45%, grade 2 diastolic dysfunction ?Mild MR ? ?Diagnosed with acute respiratory failure, acute viral bronchitis.,  Also treated with IV diuretics, steroids, nebulizers, Mucinex, albuterolantibiotics ? ?In follow-up today reports that she feels well on her current medications, feels back to her normal ? ?Feels blood pressure is well controlled on carvedilol, Entresto, spironolactone, Lasix ? ?Notes also detailing prior history of anxiety attack ?History of requiring benzos, ? ?No regular exercise program, walks around the  house ? ?EKG personally reviewed by myself on todays visit ?Normal sinus rhythm rate 67 bpm PACs ? ? ?PMH:   has a past medical history of Anxiety, Chronic HFmrEF, Glaucoma, History of stress test, HTN (hypertension), and Morbid obesity (HCC). ? ?PSH:    ?Past Surgical History:  ?Procedure Laterality Date  ? CESAREAN SECTION    ? ? ?Current Outpatient Medications  ?Medication Sig Dispense Refill  ? carvedilol (COREG) 6.25 MG tablet Take 6.25 mg by mouth 2 (two) times daily with a meal.    ? furosemide (LASIX) 20 MG tablet Take 20 mg by mouth daily.    ? latanoprost (XALATAN) 0.005 % ophthalmic solution Place 1 drop into both eyes at bedtime.    ? sacubitril-valsartan (ENTRESTO) 49-51 MG Take 1 tablet by mouth 2 (two) times daily.    ? Spacer/Aero-Holding Chambers (AEROCHAMBER MV) inhaler Use as instructed 1 each 2  ? spironolactone (ALDACTONE) 25 MG tablet Take 0.5 tablets (12.5 mg total) by mouth daily. 15 tablet 0  ? timolol (TIMOPTIC) 0.5 % ophthalmic solution Place 1 drop into the left eye daily.    ? albuterol (VENTOLIN HFA) 108 (90 Base) MCG/ACT inhaler Inhale 2 puffs into the lungs every 4 (four) hours as needed. (Patient not taking: Reported on 08/29/2021) 18 g 0  ? ?No current facility-administered medications for this visit.  ? ? ? ?Allergies:   Aspirin, Iodine, Mercury, and Tylenol [acetaminophen]  ? ?Social History:  The patient  reports that she has never smoked. She has never used smokeless tobacco. She reports current alcohol use. She reports that she does not use drugs.  ? ?Family History:   family history includes Heart disease in her father; Hypertension in her father.  ? ? ?Review of  Systems: ?Review of Systems  ?Constitutional: Negative.   ?HENT: Negative.    ?Respiratory: Negative.    ?Cardiovascular: Negative.   ?Gastrointestinal: Negative.   ?Musculoskeletal: Negative.   ?Neurological: Negative.   ?Psychiatric/Behavioral: Negative.    ?All other systems reviewed and are negative. ? ? ?PHYSICAL  EXAM: ?VS:  BP 130/80 (BP Location: Left Arm, Patient Position: Sitting, Cuff Size: Normal)   Pulse 67   Ht 5\' 2"  (1.575 m)   Wt 83.1 kg   SpO2 96%   BMI 33.52 kg/m?  , BMI Body mass index is 33.52 kg/m?. ?GEN: Well nourished, well developed, in no acute distress ?HEENT: normal ?Neck: no JVD, carotid bruits, or masses ?Cardiac: RRR; no murmurs, rubs, or gallops,no edema  ?Respiratory:  clear to auscultation bilaterally, normal work of breathing ?GI: soft, nontender, nondistended, + BS ?MS: no deformity or atrophy ?Skin: warm and dry, no rash ?Neuro:  Strength and sensation are intact ?Psych: euthymic mood, full affect ? ?Recent Labs: ?08/14/2021: ALT 10; B Natriuretic Peptide 326.8 ?08/15/2021: Hemoglobin 14.1; Magnesium 1.9; Platelets 182 ?08/29/2021: BUN 21; Creatinine, Ser 0.79; Potassium 4.5; Sodium 140  ? ? ?Lipid Panel ?No results found for: CHOL, HDL, LDLCALC, TRIG ?  ? ?Wt Readings from Last 3 Encounters:  ?09/17/21 83.1 kg  ?08/29/21 82.1 kg  ?08/16/21 80.1 kg  ?  ? ?ASSESSMENT AND PLAN: ? ?Problem List Items Addressed This Visit   ? ? Acute respiratory failure with hypoxia (HCC)  ? HTN (hypertension)  ? Chronic systolic CHF (congestive heart failure) (HCC) - Primary  ? ?Acute on chronic respiratory distress ?Recent hospitalization for viral bronchitis, treated with steroids, antibiotics, symptoms improved on today's visit ? ?Acute on chronic diastolic and systolic CHF ?Known history of cardiomyopathy first diagnosed in 10/16/21,  ?results of echocardiogram discussed in detail today ?Appears relatively euvolemic, difficult to interpret in the setting of morbid obesity ?No medication changes made ?Recommended extra Lasix for any ankle swelling abdominal distention ?Recommend she moderate her fluid intake ?Recommend daily weights ?Family to assist in her care ?Lab work reviewed, stable renal function ? ?Essential hypertension ?Blood pressure is well controlled on today's visit. No changes made to the  medications. ? ? ? Total encounter time more than 30 minutes ? Greater than 50% was spent in counseling and coordination of care with the patient ? ? ? ?Signed, ?[place], M.D., Ph.D. ?[facility] [place], [place] ?[phone] ?

## 2021-09-17 ENCOUNTER — Ambulatory Visit (INDEPENDENT_AMBULATORY_CARE_PROVIDER_SITE_OTHER): Payer: Medicare Other | Admitting: Cardiovascular Disease

## 2021-09-17 ENCOUNTER — Encounter: Payer: Self-pay | Admitting: Cardiovascular Disease

## 2021-09-17 VITALS — BP 130/80 | HR 67 | Ht 62.0 in | Wt 183.2 lb

## 2021-09-17 DIAGNOSIS — I5022 Chronic systolic (congestive) heart failure: Secondary | ICD-10-CM | POA: Diagnosis not present

## 2021-09-17 DIAGNOSIS — J9601 Acute respiratory failure with hypoxia: Secondary | ICD-10-CM

## 2021-09-17 DIAGNOSIS — I10 Essential (primary) hypertension: Secondary | ICD-10-CM

## 2021-09-17 MED ORDER — ENTRESTO 49-51 MG PO TABS: 1.0000 | ORAL_TABLET | Freq: Two times a day (BID) | ORAL | 3 refills | Status: DC

## 2021-09-17 MED ORDER — SPIRONOLACTONE 25 MG PO TABS: 12.5000 mg | ORAL_TABLET | Freq: Every day | ORAL | 3 refills | Status: DC

## 2021-09-17 MED ORDER — FUROSEMIDE 20 MG PO TABS: 20.0000 mg | ORAL_TABLET | Freq: Every day | ORAL | 3 refills | Status: DC

## 2021-09-17 MED ORDER — CARVEDILOL 6.25 MG PO TABS: 6.2500 mg | ORAL_TABLET | Freq: Two times a day (BID) | ORAL | 3 refills | Status: DC

## 2021-09-17 NOTE — Patient Instructions (Addendum)
Medication Instructions:  ?No changes ? ?But take extra lasix/furosemide as needed for leg swelling, shortness of breath, ABD swelling ? ?Refills 90 days ? ?If you need a refill on your cardiac medications before your next appointment, please call your pharmacy.  ? ?Lab work: ?No new labs needed ? ?Testing/Procedures: ?No new testing needed ? ?Follow-Up: ?At [facility], you and your health needs are our priority.  As part of our continuing mission to provide you with exceptional heart care, we have created designated Provider Care Teams.  These Care Teams include your primary Cardiologist (physician) and Advanced Practice Providers (APPs -  Physician Assistants and Nurse Practitioners) who all work together to provide you with the care you need, when you need it. ? ?You will need a follow up appointment in 6 months, APP OK ? ?Providers on your designated Care Team:   ?Nicolasa Ducking, NP ?Eula Listen, PA-C ?Cadence Fransico Michael, PA-C ? ?COVID-19 Vaccine Information can be found at: [email or web address] For questions related to vaccine distribution or appointments, please email [email or web address] or call [phone].  ? ?

## 2021-10-22 NOTE — Progress Notes (Signed)
? Patient ID: Diana Garrett, female    DOB: Nov 19, 1929, 88 y.o.   MRN: [id number] ? ?HPI ? ?Offered interpreter but patient declined ? ? ?Ms Strayer is a 88 y/o female with a history of HTN, anxiety, glaucoma, obesity and chronic heart failure.  ? ?Echo report from 08/14/21 reviewed and showed an EF of 40-45% along with mild LAE and mild MR.  ? ?Admitted 08/14/21 due to worsening SOB and cough. Cardiology consult obtained. Weaned off of supplemental oxygen. Given antibiotics/ steroids for possible bronchitis. PT/OT evaluation done. Hypokalemia corrected. Discharged after 2 days.  ? ?She presents today for a follow-up visit with a chief complaint of minimal fatigue upon moderate exertion. Describes this as chronic in nature having been present for several years. She has associated shortness of breath, cough and anxiety along with this. She denies any difficulty sleeping, dizziness, abdominal distention, palpitations, pedal edema, chest pain, wheezing or weight gain.  ? ?Admits to not drinking much fluids during the day because she's afraid of drinking too much.  ? ?Past Medical History:  ?Diagnosis Date  ? Anxiety   ? Chronic HFmrEF   ? a. 09/2019 EF 45-50%; b. 08/2021 Echo: EF 40-45%, glob HK, GrII, DD, nl RV fxn, mildly dil LA, mild MR.  ? Glaucoma   ? History of stress test   ? a. Reports normal stress test in [place], [place] ~ 2017.  ? HTN (hypertension)   ? Morbid obesity (HCC)   ? ?Past Surgical History:  ?Procedure Laterality Date  ? CESAREAN SECTION    ? ?Family History  ?Problem Relation Age of Onset  ? Hypertension Father   ? Heart disease Father   ? ?Social History  ? ?Tobacco Use  ? Smoking status: Never  ? Smokeless tobacco: Never  ?Substance Use Topics  ? Alcohol use: Yes  ?  Comment: rare  ? ?Allergies  ?Allergen Reactions  ? Aspirin   ? Iodine   ? Mercury   ? Tylenol [Acetaminophen]   ? ?Prior to Admission medications   ?Medication Sig Start Date End Date Taking? Authorizing Provider  ?carvedilol (COREG) 6.25 MG  tablet Take 1 tablet (6.25 mg total) by mouth 2 (two) times daily with a meal. 09/17/21  Yes Gollan, Tollie Pizza, MD  ?furosemide (LASIX) 20 MG tablet Take 1 tablet (20 mg total) by mouth daily. ?Patient taking differently: Take 20 mg by mouth 2 (two) times daily. 09/17/21  Yes Gollan, Tollie Pizza, MD  ?latanoprost (XALATAN) 0.005 % ophthalmic solution Place 1 drop into both eyes at bedtime. 08/03/21  Yes [provider]  ?sacubitril-valsartan (ENTRESTO) 49-51 MG Take 1 tablet by mouth 2 (two) times daily. 09/17/21  Yes Antonieta Iba, MD  ?spironolactone (ALDACTONE) 25 MG tablet Take 0.5 tablets (12.5 mg total) by mouth daily. 09/17/21  Yes Antonieta Iba, MD  ?timolol (TIMOPTIC) 0.5 % ophthalmic solution Place 1 drop into the left eye daily. 08/12/21  Yes [provider]  ?albuterol (VENTOLIN HFA) 108 (90 Base) MCG/ACT inhaler Inhale 2 puffs into the lungs every 4 (four) hours as needed. ?Patient not taking: Reported on 08/29/2021 06/07/21   Becky Augusta, NP  ?Spacer/Aero-Holding Chambers (AEROCHAMBER MV) inhaler Use as instructed ?Patient not taking: Reported on 10/23/2021 06/07/21   Becky Augusta, NP  ? ? ?Review of Systems  ?Constitutional:  Positive for fatigue. Negative for appetite change.  ?HENT:  Negative for congestion, postnasal drip and sore throat.   ?Eyes:  Positive for visual disturbance (legally blind).  ?Respiratory:  Positive for cough (at times) and shortness of breath (with moderate exertion). Negative for chest tightness.   ?Cardiovascular:  Negative for chest pain, palpitations and leg swelling.  ?Gastrointestinal:  Negative for abdominal distention and abdominal pain.  ?Endocrine: Negative.   ?Genitourinary: Negative.   ?Musculoskeletal:  Negative for back pain and neck pain.  ?Skin: Negative.   ?Allergic/Immunologic: Negative.   ?Neurological:  Negative for dizziness and light-headedness.  ?Hematological:  Negative for adenopathy. Does not bruise/bleed easily.   ?Psychiatric/Behavioral:  Negative for dysphoric mood and sleep disturbance (sleeping on 2 pillow). The patient is nervous/anxious.   ? ?Vitals:  ? 10/23/21 1333  ?BP: (!) 137/57  ?Pulse: 61  ?Resp: 18  ?SpO2: 100%  ?Weight: 182 lb (82.6 kg)  ?Height: 5\' 2"  (1.575 m)  ? ?Wt Readings from Last 3 Encounters:  ?10/23/21 182 lb (82.6 kg)  ?09/17/21 183 lb 4 oz (83.1 kg)  ?08/29/21 181 lb (82.1 kg)  ? ?Lab Results  ?Component Value Date  ? CREATININE 0.79 08/29/2021  ? CREATININE 0.65 08/16/2021  ? CREATININE 0.69 08/15/2021  ? ?Physical Exam ?Vitals and nursing note reviewed. Exam conducted with a chaperone present (son & daughter).  ?Constitutional:   ?   Appearance: Normal appearance.  ?HENT:  ?   Head: Normocephalic and atraumatic.  ?Cardiovascular:  ?   Rate and Rhythm: Normal rate and regular rhythm.  ?Pulmonary:  ?   Effort: Pulmonary effort is normal. No respiratory distress.  ?   Breath sounds: No wheezing or rales.  ?Abdominal:  ?   General: There is no distension.  ?   Palpations: Abdomen is soft.  ?Musculoskeletal:     ?   General: No tenderness.  ?   Cervical back: Normal range of motion and neck supple.  ?   Right lower leg: No edema.  ?   Left lower leg: No edema.  ?Skin: ?   General: Skin is warm and dry.  ?Neurological:  ?   General: No focal deficit present.  ?   Mental Status: She is alert and oriented to person, place, and time.  ?Psychiatric:     ?   Mood and Affect: Mood normal.     ?   Behavior: Behavior normal.     ?   Thought Content: Thought content normal.  ?  ?Assessment & Plan: ? ?1: Chronic heart failure with mildly reduced ejection fraction- ?- NYHA class II ?- euvolemic today ?- weighing daily and understands to call for an overnight weight gain of >2 pounds or a weekly weight gain of > 5 pounds ?- weight stable from last visit here 2 months ago ?- saw cardiology 10/15/2021) 09/17/21 ?- not adding salt and son is reading food labels for sodium content ?- encouraged to drink ~ 60 ounces of  fluids daily ?- on GDMT of carvedilol, entresto and spironolactone ?- add farxiga 10mg  daily; may be able to decrease furosemide to daily or PRN ?- check BMP at next visit ?- PT finished at home ?- BNP 08/14/21 was 326.8 ? ?2: HTN- ?- BP mildly elevated (137/57) ?- saw PCP ) 09/17/20; returns next week ?- BMP 08/29/21 reviewed and showed sodium 140, potassium 4.5, creatinine 0.79 and GFR >60 ? ? ?Reviewed medication bottles. ? ?Return in 1 month, sooner if needed.   ? ? ?

## 2021-10-23 ENCOUNTER — Encounter: Payer: Self-pay | Admitting: Family

## 2021-10-23 ENCOUNTER — Ambulatory Visit: Payer: Medicare Other | Attending: Family | Admitting: Family

## 2021-10-23 VITALS — BP 137/57 | HR 61 | Resp 18 | Ht 62.0 in | Wt 182.0 lb

## 2021-10-23 DIAGNOSIS — F419 Anxiety disorder, unspecified: Secondary | ICD-10-CM | POA: Diagnosis not present

## 2021-10-23 DIAGNOSIS — I10 Essential (primary) hypertension: Secondary | ICD-10-CM

## 2021-10-23 DIAGNOSIS — I110 Hypertensive heart disease with heart failure: Secondary | ICD-10-CM | POA: Insufficient documentation

## 2021-10-23 DIAGNOSIS — H409 Unspecified glaucoma: Secondary | ICD-10-CM | POA: Diagnosis not present

## 2021-10-23 DIAGNOSIS — I509 Heart failure, unspecified: Secondary | ICD-10-CM | POA: Diagnosis present

## 2021-10-23 DIAGNOSIS — R0602 Shortness of breath: Secondary | ICD-10-CM | POA: Insufficient documentation

## 2021-10-23 DIAGNOSIS — R059 Cough, unspecified: Secondary | ICD-10-CM | POA: Diagnosis not present

## 2021-10-23 DIAGNOSIS — I5022 Chronic systolic (congestive) heart failure: Secondary | ICD-10-CM | POA: Diagnosis not present

## 2021-10-23 MED ORDER — DAPAGLIFLOZIN PROPANEDIOL 10 MG PO TABS
10.0000 mg | ORAL_TABLET | Freq: Every day | ORAL | 5 refills | Status: DC
Start: 2021-10-23 — End: 2021-12-25

## 2021-10-23 NOTE — Patient Instructions (Addendum)
Continue weighing daily and call for an overnight weight gain of 3 pounds or more or a weekly weight gain of more than 5 pounds. ? ? ?If you have voicemail, please make sure your mailbox is cleaned out so that we may leave a message and please make sure to listen to any voicemails.  ? ? ?Drink 60-64 ounces of fluids daily ? ? ?Start taking farxiga as 1 tablet every morning ?

## 2021-12-03 NOTE — Progress Notes (Deleted)
Patient ID: Diana Garrett, female    DOB: [birth year], 88 y.o.   MRN: [id number]  HPI  Offered interpreter but patient declined   Diana Garrett is a 88 y/o female with a history of HTN, anxiety, glaucoma, obesity and chronic heart failure.   Echo report from 08/14/21 reviewed and showed an EF of 40-45% along with mild LAE and mild MR.   Admitted 08/14/21 due to worsening SOB and cough. Cardiology consult obtained. Weaned off of supplemental oxygen. Given antibiotics/ steroids for possible bronchitis. PT/OT evaluation done. Hypokalemia corrected. Discharged after 2 days.   She presents today for a follow-up visit with a chief complaint of   Admits to not drinking much fluids during the day because she's afraid of drinking too much.   Past Medical History:  Diagnosis Date   Anxiety    Chronic HFmrEF    a. 09/2019 EF 45-50%; b. 08/2021 Echo: EF 40-45%, glob HK, GrII, DD, nl RV fxn, mildly dil LA, mild MR.   Glaucoma    History of stress test    a. Reports normal stress test in [place], [place] ~ 2017.   HTN (hypertension)    Morbid obesity (HCC)    Past Surgical History:  Procedure Laterality Date   CESAREAN SECTION     Family History  Problem Relation Age of Onset   Hypertension Father    Heart disease Father    Social History   Tobacco Use   Smoking status: Never   Smokeless tobacco: Never  Substance Use Topics   Alcohol use: Yes    Comment: rare   Allergies  Allergen Reactions   Aspirin    Iodine    Mercury    Tylenol [Acetaminophen]      Review of Systems  Constitutional:  Positive for fatigue. Negative for appetite change.  HENT:  Negative for congestion, postnasal drip and sore throat.   Eyes:  Positive for visual disturbance (legally blind).  Respiratory:  Positive for cough (at times) and shortness of breath (with moderate exertion). Negative for chest tightness.   Cardiovascular:  Negative for chest pain, palpitations and leg swelling.  Gastrointestinal:  Negative  for abdominal distention and abdominal pain.  Endocrine: Negative.   Genitourinary: Negative.   Musculoskeletal:  Negative for back pain and neck pain.  Skin: Negative.   Allergic/Immunologic: Negative.   Neurological:  Negative for dizziness and light-headedness.  Hematological:  Negative for adenopathy. Does not bruise/bleed easily.  Psychiatric/Behavioral:  Negative for dysphoric mood and sleep disturbance (sleeping on 2 pillow). The patient is nervous/anxious.       Physical Exam Vitals and nursing note reviewed. Exam conducted with a chaperone present (son & daughter).  Constitutional:      Appearance: Normal appearance.  HENT:     Head: Normocephalic and atraumatic.  Cardiovascular:     Rate and Rhythm: Normal rate and regular rhythm.  Pulmonary:     Effort: Pulmonary effort is normal. No respiratory distress.     Breath sounds: No wheezing or rales.  Abdominal:     General: There is no distension.     Palpations: Abdomen is soft.  Musculoskeletal:        General: No tenderness.     Cervical back: Normal range of motion and neck supple.     Right lower leg: No edema.     Left lower leg: No edema.  Skin:    General: Skin is warm and dry.  Neurological:     General: No focal  deficit present.     Mental Status: She is alert and oriented to person, place, and time.  Psychiatric:        Mood and Affect: Mood normal.        Behavior: Behavior normal.        Thought Content: Thought content normal.     Assessment & Plan:  1: Chronic heart failure with mildly reduced ejection fraction- - NYHA class II - euvolemic today - weighing daily and understands to call for an overnight weight gain of >2 pounds or a weekly weight gain of > 5 pounds - weight 182 from last visit here 6 weeks ago - saw cardiology Diana Garrett) 09/17/21 - not adding salt and son is reading food labels for sodium content - encouraged to drink ~ 60 ounces of fluids daily - on GDMT of carvedilol, entresto,  farxiga and spironolactone - check BMP today since farxiga added at last visit - PT finished at home - BNP 08/14/21 was 326.8  2: HTN- - BP  - saw PCP Diana Garrett) 10/30/21 - BMP 08/29/21 reviewed and showed sodium 140, potassium 4.5, creatinine 0.79 and GFR >60   Reviewed medication bottles.

## 2021-12-04 ENCOUNTER — Ambulatory Visit: Payer: Medicare Other | Admitting: Family

## 2021-12-21 ENCOUNTER — Other Ambulatory Visit: Payer: Self-pay | Admitting: Cardiovascular Disease

## 2021-12-23 NOTE — Telephone Encounter (Signed)
Please review for refill. Thank you! 

## 2021-12-25 ENCOUNTER — Encounter: Payer: Self-pay | Admitting: Family

## 2021-12-25 ENCOUNTER — Other Ambulatory Visit
Admission: RE | Admit: 2021-12-25 | Discharge: 2021-12-25 | Disposition: A | Discharge status: Home / Self Care | Payer: Medicare Other | Source: Ambulatory Visit | Attending: Family | Admitting: Family

## 2021-12-25 ENCOUNTER — Ambulatory Visit (HOSPITAL_BASED_OUTPATIENT_CLINIC_OR_DEPARTMENT_OTHER): Payer: Medicare Other | Admitting: Family

## 2021-12-25 VITALS — BP 149/59 | HR 63 | Resp 14 | Ht 62.0 in | Wt 179.5 lb

## 2021-12-25 DIAGNOSIS — I10 Essential (primary) hypertension: Secondary | ICD-10-CM | POA: Diagnosis not present

## 2021-12-25 DIAGNOSIS — I5022 Chronic systolic (congestive) heart failure: Secondary | ICD-10-CM | POA: Diagnosis not present

## 2021-12-25 LAB — BASIC METABOLIC PANEL
Anion gap: 9 (ref 5–15)
BUN: 18 mg/dL (ref 8–23)
CO2: 28 mmol/L (ref 22–32)
Calcium: 9.3 mg/dL (ref 8.9–10.3)
Chloride: 102 mmol/L (ref 98–111)
Creatinine, Ser: 0.78 mg/dL (ref 0.44–1.00)
GFR, Estimated: >60 mL/min (ref >60)
Glucose, Bld: 105 mg/dL — ABNORMAL HIGH (ref 70–99)
Potassium: 4.1 mmol/L (ref 3.5–5.1)
Sodium: 139 mmol/L (ref 135–145)

## 2021-12-25 NOTE — Progress Notes (Signed)
Patient ID: Diana Garrett, female    DOB: 1929/10/14, 88 y.o.   MRN: [id number]  HPI  Offered interpreter but patient declined   Diana Garrett is a 88 y/o female with a history of HTN, anxiety, glaucoma, obesity and chronic heart failure.   Echo report from 08/14/21 reviewed and showed an EF of 40-45% along with mild LAE and mild MR.   Admitted 08/14/21 due to worsening SOB and cough. Cardiology consult obtained. Weaned off of supplemental oxygen. Given antibiotics/ steroids for possible bronchitis. PT/OT evaluation done. Hypokalemia corrected. Discharged after 2 days.   She presents today for a follow-up visit with a chief complaint of minimal fatigue upon moderate exertion. Describes this as chronic although occurring on an infrequent basis. She has associated visual difficulties, cough and anxiety along with this. She denies any dizziness, difficulty sleeping, abdominal distention, palpitations, pedal edema, chest pain, shortness of breath or fatigue.   Decided to not start farxiga as discussed at last visit because she decided that she was doing well and at 88 y/o, she didn't want to "rock the boat". Has been taking furosemide BID since ~ mid May when she went out of state to visit family because she didn't want to have fluid issues when out of state.  Is wondering if she can continue this dose or if she needs to reduce it back to daily.   Past Medical History:  Diagnosis Date   Anxiety    Chronic HFmrEF    a. 09/2019 EF 45-50%; b. 08/2021 Echo: EF 40-45%, glob HK, GrII, DD, nl RV fxn, mildly dil LA, mild MR.   Glaucoma    History of stress test    a. Reports normal stress test in [place], [place] ~ 2017.   HTN (hypertension)    Morbid obesity (HCC)    Past Surgical History:  Procedure Laterality Date   CESAREAN SECTION     Family History  Problem Relation Age of Onset   Hypertension Father    Heart disease Father    Social History   Tobacco Use   Smoking status: Never   Smokeless  tobacco: Never  Substance Use Topics   Alcohol use: Yes    Comment: rare   Allergies  Allergen Reactions   Aspirin    Iodine    Mercury    Tylenol [Acetaminophen]    Prior to Admission medications   Medication Sig Start Date End Date Taking? Authorizing Provider  carvedilol (COREG) 6.25 MG tablet Take 1 tablet (6.25 mg total) by mouth 2 (two) times daily with a meal. 09/17/21  Yes Gollan, Tollie Pizza, MD  furosemide (LASIX) 20 MG tablet TAKE 1 TABLET(20 MG) BY MOUTH DAILY 12/23/21  Yes Drayke Grabel A, FNP  latanoprost (XALATAN) 0.005 % ophthalmic solution Place 1 drop into both eyes at bedtime. 08/03/21  Yes [provider]  sacubitril-valsartan (ENTRESTO) 49-51 MG Take 1 tablet by mouth 2 (two) times daily. 09/17/21  Yes Antonieta Iba, MD  spironolactone (ALDACTONE) 25 MG tablet Take 0.5 tablets (12.5 mg total) by mouth daily. 09/17/21  Yes Gollan, Tollie Pizza, MD  timolol (TIMOPTIC) 0.5 % ophthalmic solution Place 1 drop into the left eye daily. 08/12/21  Yes [provider]  albuterol (VENTOLIN HFA) 108 (90 Base) MCG/ACT inhaler Inhale 2 puffs into the lungs every 4 (four) hours as needed. Patient not taking: Reported on 08/29/2021 06/07/21   Becky Augusta, NP  Spacer/Aero-Holding Chambers (AEROCHAMBER MV) inhaler Use as instructed Patient not taking: Reported on  10/23/2021 06/07/21   Becky Augusta, NP   Review of Systems  Constitutional:  Positive for fatigue. Negative for appetite change.  HENT:  Negative for congestion, postnasal drip and sore throat.   Eyes:  Positive for visual disturbance (legally blind).  Respiratory:  Positive for cough (at times). Negative for chest tightness and shortness of breath.   Cardiovascular:  Negative for chest pain, palpitations and leg swelling.  Gastrointestinal:  Negative for abdominal distention and abdominal pain.  Endocrine: Negative.   Genitourinary: Negative.   Musculoskeletal:  Negative for back pain and neck pain.  Skin:  Negative.   Allergic/Immunologic: Negative.   Neurological:  Negative for dizziness and light-headedness.  Hematological:  Negative for adenopathy. Does not bruise/bleed easily.  Psychiatric/Behavioral:  Negative for dysphoric mood and sleep disturbance (sleeping on 2 pillow). The patient is nervous/anxious.    Vitals:   12/25/21 1423  BP: (!) 149/59  Pulse: 63  Resp: 14  SpO2: 100%  Weight: 179 lb 8 oz (81.4 kg)  Height: 5\' 2"  (1.575 m)   Wt Readings from Last 3 Encounters:  12/25/21 179 lb 8 oz (81.4 kg)  10/23/21 182 lb (82.6 kg)  09/17/21 183 lb 4 oz (83.1 kg)   Lab Results  Component Value Date   CREATININE 0.78 12/25/2021   CREATININE 0.79 08/29/2021   CREATININE 0.65 08/16/2021   Physical Exam Vitals and nursing note reviewed. Exam conducted with a chaperone present (son & daughter).  Constitutional:      Appearance: Normal appearance.  HENT:     Head: Normocephalic and atraumatic.  Cardiovascular:     Rate and Rhythm: Normal rate and regular rhythm.  Pulmonary:     Effort: Pulmonary effort is normal. No respiratory distress.     Breath sounds: No wheezing or rales.  Abdominal:     General: There is no distension.     Palpations: Abdomen is soft.  Musculoskeletal:        General: No tenderness.     Cervical back: Normal range of motion and neck supple.     Right lower leg: No edema.     Left lower leg: No edema.  Skin:    General: Skin is warm and dry.  Neurological:     General: No focal deficit present.     Mental Status: She is alert and oriented to person, place, and time.  Psychiatric:        Mood and Affect: Mood normal.        Behavior: Behavior normal.        Thought Content: Thought content normal.    Assessment & Plan:  1: Chronic heart failure with mildly reduced ejection fraction- - NYHA class II - euvolemic today - weighing daily and understands to call for an overnight weight gain of >2 pounds or a weekly weight gain of > 5 pounds -  weight down 3 pounds from last visit here 6 weeks ago - saw cardiology 10/16/2021) 09/17/21 - not adding salt and son is reading food labels for sodium content - encouraged to drink ~ 60 ounces of fluids daily - on GDMT of carvedilol, entresto and spironolactone - did not start farxiga as she was concerned about possible side effects and at 88 y/o, she was feeling well without it - has been taking furosemide BID since ~ mid May and is wondering if she should continue this dose or decrease it back to daily; will check BMP today and call them tomorrow  - BNP 08/14/21  was 326.8  2: HTN- - BP mildly elevated (149/59) - saw PCP Johnn Hai) 10/30/21 - BMP 08/29/21 reviewed and showed sodium 140, potassium 4.5, creatinine 0.79 and GFR >60   Reviewed medication bottles.  Return in 2 months, sooner if needed.

## 2021-12-25 NOTE — Patient Instructions (Addendum)
Continue weighing daily and call for an overnight weight gain of 3 pounds or more or a weekly weight gain of more than 5 pounds.   If you have voicemail, please make sure your mailbox is cleaned out so that we may leave a message and please make sure to listen to any voicemails.    I will call when I get the lab work back

## 2021-12-26 ENCOUNTER — Telehealth: Payer: Self-pay

## 2021-12-26 ENCOUNTER — Other Ambulatory Visit: Payer: Self-pay | Admitting: Family

## 2021-12-26 MED ORDER — FUROSEMIDE 20 MG PO TABS: 20.0000 mg | ORAL_TABLET | Freq: Two times a day (BID) | ORAL | 5 refills | Status: DC

## 2021-12-26 NOTE — Telephone Encounter (Addendum)
Called patient's phone and spoke with daughter in law that patient currently lives with. She verbalized understanding of how to take lasix and states they have no further questions or concerns at this time. Suanne Marker, RN ----- Message from Delma Freeze, FNP sent at 12/26/2021  7:40 AM EDT ----- Potassium and kidney function look great. You can continue taking 2 lasix daily if you need to. If you don't need to take 2, you can always just take 1 daily. A new prescription has been sent to your pharmacy with these directions.

## 2022-02-25 NOTE — Progress Notes (Addendum)
Patient ID: Diana Garrett, female    DOB: 08-17-1929, 88 y.o.   MRN: [id number]  HPI  Offered interpreter but patient declined   Ms Driver is a 88 y/o female with a history of HTN, anxiety, glaucoma, obesity and chronic heart failure.   Echo report from 08/14/21 reviewed and showed an EF of 40-45% along with mild LAE and mild MR.   Has not been admitted or been in the ED in the last 6 months.   She presents today for a follow-up visit with a chief complaint of minimal fatigue with moderate exertion. Describes this as chronic in nature. She has associated visual disturbances (legally blind) and anxiety along with this. She denies any difficulty sleeping, dizziness, abdominal distention, palpitations, pedal edema, chest pain, shortness of breath, cough or weight gain.   Admits that she recently she has eaten pork rinds. Is getting ready to go to [place] for ~ 1 month to visit family. Will have access to a scale while she is there.   Past Medical History:  Diagnosis Date   Anxiety    Chronic HFmrEF    a. 09/2019 EF 45-50%; b. 08/2021 Echo: EF 40-45%, glob HK, GrII, DD, nl RV fxn, mildly dil LA, mild MR.   Glaucoma    History of stress test    a. Reports normal stress test in [place], [place] ~ 2017.   HTN (hypertension)    Morbid obesity (HCC)    Past Surgical History:  Procedure Laterality Date   CESAREAN SECTION     Family History  Problem Relation Age of Onset   Hypertension Father    Heart disease Father    Social History   Tobacco Use   Smoking status: Never   Smokeless tobacco: Never  Substance Use Topics   Alcohol use: Yes    Comment: rare   Allergies  Allergen Reactions   Aspirin    Iodine    Mercury    Tylenol [Acetaminophen]    Prior to Admission medications   Medication Sig Start Date End Date Taking? Authorizing Provider  carvedilol (COREG) 6.25 MG tablet Take 1 tablet (6.25 mg total) by mouth 2 (two) times daily with a meal. 09/17/21  Yes Gollan, Kathlene November, MD   furosemide (LASIX) 20 MG tablet Take 1 tablet (20 mg total) by mouth 2 (two) times daily. 12/26/21  Yes Ardit Danh A, FNP  latanoprost (XALATAN) 0.005 % ophthalmic solution Place 1 drop into both eyes at bedtime. 08/03/21  Yes [provider]  sacubitril-valsartan (ENTRESTO) 49-51 MG Take 1 tablet by mouth 2 (two) times daily. 09/17/21  Yes Minna Merritts, MD  spironolactone (ALDACTONE) 25 MG tablet Take 0.5 tablets (12.5 mg total) by mouth daily. 09/17/21  Yes Gollan, Kathlene November, MD  timolol (TIMOPTIC) 0.5 % ophthalmic solution Place 1 drop into the left eye daily. 08/12/21  Yes [provider]  albuterol (VENTOLIN HFA) 108 (90 Base) MCG/ACT inhaler Inhale 2 puffs into the lungs every 4 (four) hours as needed. Patient not taking: Reported on 08/29/2021 06/07/21   Margarette Canada, NP  Spacer/Aero-Holding Josiah Lobo (AEROCHAMBER MV) inhaler Use as instructed Patient not taking: Reported on 10/23/2021 06/07/21   Margarette Canada, NP    Review of Systems  Constitutional:  Positive for fatigue. Negative for appetite change.  HENT:  Negative for congestion, postnasal drip and sore throat.   Eyes:  Positive for visual disturbance (legally blind).  Respiratory:  Negative for cough (at times), chest tightness and shortness of breath.  Cardiovascular:  Negative for chest pain, palpitations and leg swelling.  Gastrointestinal:  Negative for abdominal distention and abdominal pain.  Endocrine: Negative.   Genitourinary: Negative.   Musculoskeletal:  Negative for back pain and neck pain.  Skin: Negative.   Allergic/Immunologic: Negative.   Neurological:  Negative for dizziness and light-headedness.  Hematological:  Negative for adenopathy. Does not bruise/bleed easily.  Psychiatric/Behavioral:  Negative for dysphoric mood and sleep disturbance (sleeping on 2 pillow). The patient is nervous/anxious.    Vitals:   02/26/22 1401  BP: (!) 157/63  Pulse: (!) 59  Resp: 20  SpO2: 100%  Weight:  182 lb 4 oz (82.7 kg)  Height: 5\' 3"  (1.6 m)   Wt Readings from Last 3 Encounters:  02/26/22 182 lb 4 oz (82.7 kg)  12/25/21 179 lb 8 oz (81.4 kg)  10/23/21 182 lb (82.6 kg)   Lab Results  Component Value Date   CREATININE 0.78 12/25/2021   CREATININE 0.79 08/29/2021   CREATININE 0.65 08/16/2021   Physical Exam Vitals and nursing note reviewed. Exam conducted with a chaperone present (son & daughter).  Constitutional:      Appearance: Normal appearance.  HENT:     Head: Normocephalic and atraumatic.  Cardiovascular:     Rate and Rhythm: Regular rhythm. Bradycardia present.  Pulmonary:     Effort: Pulmonary effort is normal. No respiratory distress.     Breath sounds: No wheezing or rales.  Abdominal:     General: There is no distension.     Palpations: Abdomen is soft.  Musculoskeletal:        General: No tenderness.     Cervical back: Normal range of motion and neck supple.     Right lower leg: Edema (trace pitting) present.     Left lower leg: Edema (trace pitting) present.  Skin:    General: Skin is warm and dry.  Neurological:     General: No focal deficit present.     Mental Status: She is alert and oriented to person, place, and time.  Psychiatric:        Mood and Affect: Mood normal.        Behavior: Behavior normal.        Thought Content: Thought content normal.    Assessment & Plan:  1: Chronic heart failure with mildly reduced ejection fraction- - NYHA class II - euvolemic today - weighing daily and understands to call for an overnight weight gain of >2 pounds or a weekly weight gain of > 5 pounds - weight up 3 pounds from last visit here 2 months ago - saw cardiology Rockey Situ) 09/17/21 - not adding salt and son is reading food labels for sodium content; she did recently eat pork rinds and her daughter didn't realize how much sodium they had in them - reviewed the importance of keeping daily sodium intake to 2000mg  and if she eats something saltier, she  has to compensate for it the rest of the day - encouraged to drink ~ 60 ounces of fluids daily - on GDMT of carvedilol, entresto and spironolactone - consider titrating up entresto but will not do this today as she's leaving for [place] this weekend and will be gone a month - did not start farxiga as she was concerned about possible side effects and at 88 y/o, she was feeling well without it - BNP 08/14/21 was 326.8  2: HTN- - BP mildly elevated (157/63) but she's always anxious when coming to appointments - saw PCP (  [place]) 10/30/21 - BMP 12/25/21 reviewed and showed sodium 139, potassium 4.1, creatinine 0.78 and GFR >60  3: Legally blind- - son requests a prescription for a wheelchair for patient  - due to visual impairment, when they take her out, they like to put her in a wheelchair for safety and there isn't always a wheelchair available where they are at - if she gets more fatigued/ unsteady at home, the wheelchair would provide some assurance - fall risk walking in unknown places - RX for a wheelchair provided and he will take it to [facility] medication list  Return in 3 months, sooner if needed

## 2022-02-26 ENCOUNTER — Ambulatory Visit: Payer: Medicare Other | Attending: Family | Admitting: Family

## 2022-02-26 ENCOUNTER — Encounter: Payer: Self-pay | Admitting: Family

## 2022-02-26 VITALS — BP 157/63 | HR 59 | Resp 20 | Ht 63.0 in | Wt 182.2 lb

## 2022-02-26 DIAGNOSIS — H538 Other visual disturbances: Secondary | ICD-10-CM | POA: Insufficient documentation

## 2022-02-26 DIAGNOSIS — I5022 Chronic systolic (congestive) heart failure: Secondary | ICD-10-CM

## 2022-02-26 DIAGNOSIS — H409 Unspecified glaucoma: Secondary | ICD-10-CM | POA: Insufficient documentation

## 2022-02-26 DIAGNOSIS — I509 Heart failure, unspecified: Secondary | ICD-10-CM | POA: Insufficient documentation

## 2022-02-26 DIAGNOSIS — H548 Legal blindness, as defined in USA: Secondary | ICD-10-CM | POA: Insufficient documentation

## 2022-02-26 DIAGNOSIS — I110 Hypertensive heart disease with heart failure: Secondary | ICD-10-CM | POA: Insufficient documentation

## 2022-02-26 DIAGNOSIS — I10 Essential (primary) hypertension: Secondary | ICD-10-CM | POA: Diagnosis not present

## 2022-02-26 DIAGNOSIS — F419 Anxiety disorder, unspecified: Secondary | ICD-10-CM | POA: Diagnosis not present

## 2022-02-26 NOTE — Patient Instructions (Signed)
Continue weighing daily and call for an overnight weight gain of 3 pounds or more or a weekly weight gain of more than 5 pounds.   If you have voicemail, please make sure your mailbox is cleaned out so that we may leave a message and please make sure to listen to any voicemails.     

## 2022-04-30 ENCOUNTER — Ambulatory Visit: Payer: Medicare Other | Admitting: Cardiovascular Disease

## 2022-05-21 ENCOUNTER — Ambulatory Visit: Payer: Medicare Other | Admitting: Family

## 2022-07-30 ENCOUNTER — Other Ambulatory Visit: Payer: Self-pay | Admitting: Cardiovascular Disease

## 2022-07-30 NOTE — Telephone Encounter (Signed)
Please schedule overdue 6 month F/U appt. Thank you!

## 2022-07-30 NOTE — Telephone Encounter (Signed)
Pt is scheduled on 5/6

## 2022-08-24 ENCOUNTER — Other Ambulatory Visit: Payer: Self-pay | Admitting: Cardiovascular Disease

## 2022-08-25 NOTE — Telephone Encounter (Signed)
Please review for refill. Thank you! 

## 2022-10-11 NOTE — Progress Notes (Unsigned)
Cardiology Office Note  Date:  10/13/2022   ID:  Diana Garrett, DOB 12/21/1929, MRN [id number]  PCP:  Cleon Dew, FNP   Chief Complaint  Patient presents with   6 month follow up     "Doing well." Medications reviewed by the patient verbally.     HPI:  Diana Garrett is a 89 year old woman with past medical history of  morbid obesity,  cardiomyopathy,  chronic diastolic/systolic CHF ejection fraction 45%,  hypertension, Who presents for routine follow-up in the office after recent hospitalization for CHF, cardiomyopathy, EF 40 to 45%  Last seen by myself in clinic April 2023  hospital August 14, 2021 with shortness of breath/CHF, viral bronchitis  LOV 4/23 Since that time reports she has been doing well with no complaints Over the winter was in [place], saw cardiology there and was told everything looked good with no significant medication changes  Weight stable at 185 pounds Reports that she takes Lasix 20 mg daily, rarely takes 20 twice daily  Friends and family presenting with her report sometimes has high salt intake, potato chips, she does  Today with little bit of ankle swelling, nothing significant  EKG personally reviewed by myself on todays visit Normal sinus rhythm rate 64 bpm left bundle branch block (new left bundle branch block finding)  Echocardiogram in the hospital ejection fraction 40 to 45%, grade 2 diastolic dysfunction Mild MR  Diagnosed with acute respiratory failure, acute viral bronchitis.,  Also treated with IV diuretics, steroids, nebulizers, Mucinex, albuterolantibiotics  Notes also detailing prior history of anxiety attack History of requiring benzos,  No regular exercise program, walks around the house  PMH:   has a past medical history of Anxiety, Chronic HFmrEF, Glaucoma, History of stress test, HTN (hypertension), and Morbid obesity (HCC).  PSH:    Past Surgical History:  Procedure Laterality Date   CESAREAN SECTION       Current Outpatient Medications  Medication Sig Dispense Refill   carvedilol (COREG) 6.25 MG tablet Take 1 tablet (6.25 mg total) by mouth 2 (two) times daily with a meal. 180 tablet 3   furosemide (LASIX) 20 MG tablet Take 1 tablet (20 mg total) by mouth 2 (two) times daily. 60 tablet 5   latanoprost (XALATAN) 0.005 % ophthalmic solution Place 1 drop into both eyes at bedtime.     sacubitril-valsartan (ENTRESTO) 49-51 MG Take 1 tablet by mouth 2 (two) times daily. 180 tablet 3   spironolactone (ALDACTONE) 25 MG tablet TAKE 1/2 TABLET(12.5 MG) BY MOUTH DAILY 45 tablet 0   timolol (TIMOPTIC) 0.5 % ophthalmic solution Place 1 drop into the left eye daily.     albuterol (VENTOLIN HFA) 108 (90 Base) MCG/ACT inhaler Inhale 2 puffs into the lungs every 4 (four) hours as needed. (Patient not taking: Reported on 08/29/2021) 18 g 0   Spacer/Aero-Holding Chambers (AEROCHAMBER MV) inhaler Use as instructed (Patient not taking: Reported on 10/23/2021) 1 each 2   No current facility-administered medications for this visit.     Allergies:   Aspirin, Iodine, Mercury, Shellfish allergy, and Tylenol [acetaminophen]   Social History:  The patient  reports that she has never smoked. She has never used smokeless tobacco. She reports current alcohol use. She reports that she does not use drugs.   Family History:   family history includes Heart disease in her father; Hypertension in her father.    Review of Systems: Review of Systems  Constitutional: Negative.   HENT: Negative.    Respiratory: Negative.  Cardiovascular: Negative.   Gastrointestinal: Negative.   Musculoskeletal: Negative.   Neurological: Negative.   Psychiatric/Behavioral: Negative.    All other systems reviewed and are negative.   PHYSICAL EXAM: VS:  BP (!) 140/80 (BP Location: Left Arm, Patient Position: Sitting, Cuff Size: Normal)   Pulse 64   Ht 5\' 3"  (1.6 m)   Wt 183 lb (83 kg)   SpO2 96%   BMI 32.42 kg/m  , BMI Body  mass index is 32.42 kg/m. Constitutional:  oriented to person, place, and time. No distress.  HENT:  Head: Grossly normal Eyes:  no discharge. No scleral icterus.  Neck: No JVD, no carotid bruits  Cardiovascular: Regular rate and rhythm, no murmurs appreciated Pulmonary/Chest: Clear to auscultation bilaterally, no wheezes or rails Abdominal: Soft.  no distension.  no tenderness.  Musculoskeletal: Normal range of motion Neurological:  normal muscle tone. Coordination normal. No atrophy Skin: Skin warm and dry Psychiatric: normal affect, pleasant  Recent Labs: 12/25/2021: BUN 18; Creatinine, Ser 0.78; Potassium 4.1; Sodium 139    Lipid Panel No results found for: "CHOL", "HDL", "LDLCALC", "TRIG"    Wt Readings from Last 3 Encounters:  10/13/22 183 lb (83 kg)  02/26/22 182 lb 4 oz (82.7 kg)  12/25/21 179 lb 8 oz (81.4 kg)     ASSESSMENT AND PLAN:  Problem List Items Addressed This Visit       Cardiology Problems   Dilated cardiomyopathy (HCC)   HTN (hypertension)   Chronic systolic CHF (congestive heart failure) (HCC) - Primary     Other   Acute respiratory failure with hypoxia (HCC)  Acute on chronic respiratory distress Prior hospitalization for viral bronchitis, treated with steroids, antibiotics,  No significant symptoms since that time  Acute on chronic diastolic and systolic CHF Ejection fraction 45% on prior echo Trace lower extremity edema, high salt intake with potato chips, she does Recommend she takes extra Lasix as needed for ankle swelling abdominal swelling shortness of breath, PND orthopnea, weight gain  Left bundle branch block New finding on EKG Close to left bundle seen on EKG March 2023 Denies angina no symptoms Given age will hold off on ischemic workup  Essential hypertension Blood pressure is well controlled on today's visit. No changes made to the medications.  CHF education discussed on today's visit  Total encounter time more than 40  minutes  Greater than 50% was spent in counseling and coordination of care with the patient    Signed, Dossie Arbour, M.D., Ph.D. [facility] [place], [place] [phone]

## 2022-10-13 ENCOUNTER — Ambulatory Visit: Payer: Medicare Other | Attending: Cardiovascular Disease | Admitting: Cardiovascular Disease

## 2022-10-13 ENCOUNTER — Encounter: Payer: Self-pay | Admitting: Cardiovascular Disease

## 2022-10-13 VITALS — BP 135/80 | HR 64 | Ht 63.0 in | Wt 183.0 lb

## 2022-10-13 DIAGNOSIS — J9601 Acute respiratory failure with hypoxia: Secondary | ICD-10-CM | POA: Diagnosis not present

## 2022-10-13 DIAGNOSIS — I5022 Chronic systolic (congestive) heart failure: Secondary | ICD-10-CM | POA: Diagnosis not present

## 2022-10-13 DIAGNOSIS — I10 Essential (primary) hypertension: Secondary | ICD-10-CM | POA: Insufficient documentation

## 2022-10-13 DIAGNOSIS — I420 Dilated cardiomyopathy: Secondary | ICD-10-CM | POA: Insufficient documentation

## 2022-10-13 MED ORDER — SPIRONOLACTONE 25 MG PO TABS: ORAL_TABLET | ORAL | 3 refills | Status: DC

## 2022-10-13 MED ORDER — ENTRESTO 49-51 MG PO TABS
1.0000 | ORAL_TABLET | Freq: Two times a day (BID) | ORAL | 3 refills | Status: DC
Start: 2022-10-13 — End: 2023-06-30

## 2022-10-13 MED ORDER — FUROSEMIDE 20 MG PO TABS: 20.0000 mg | ORAL_TABLET | Freq: Two times a day (BID) | ORAL | 3 refills | Status: AC

## 2022-10-13 MED ORDER — CARVEDILOL 6.25 MG PO TABS: 6.2500 mg | ORAL_TABLET | Freq: Two times a day (BID) | ORAL | 3 refills | Status: AC

## 2022-10-13 NOTE — Patient Instructions (Signed)
Medication Instructions:  No changes  If you need a refill on your cardiac medications before your next appointment, please call your pharmacy.    Lab work: No new labs needed   Testing/Procedures: No new testing needed   Follow-Up: At [facility], you and your health needs are our priority.  As part of our continuing mission to provide you with exceptional heart care, we have created designated Provider Care Teams.  These Care Teams include your primary Cardiologist (physician) and Advanced Practice Providers (APPs -  Physician Assistants and Nurse Practitioners) who all work together to provide you with the care you need, when you need it.  You will need a follow up appointment in 6 months  Providers on your designated Care Team:   Christopher Berge, NP Ryan Dunn, PA-C Cadence Furth, PA-C  COVID-19 Vaccine Information can be found at: [email or web address] For questions related to vaccine distribution or appointments, please email [email or web address] or call [phone].   

## 2022-12-25 ENCOUNTER — Encounter: Payer: Self-pay | Admitting: Family

## 2022-12-25 ENCOUNTER — Other Ambulatory Visit
Admission: RE | Admit: 2022-12-25 | Discharge: 2022-12-25 | Disposition: A | Discharge status: Home / Self Care | Payer: Medicare Other | Source: Ambulatory Visit | Attending: Family | Admitting: Family

## 2022-12-25 ENCOUNTER — Ambulatory Visit: Payer: Medicare Other | Admitting: Family

## 2022-12-25 VITALS — BP 120/57 | HR 62 | Wt 182.0 lb

## 2022-12-25 DIAGNOSIS — I5022 Chronic systolic (congestive) heart failure: Secondary | ICD-10-CM | POA: Diagnosis not present

## 2022-12-25 DIAGNOSIS — I10 Essential (primary) hypertension: Secondary | ICD-10-CM

## 2022-12-25 LAB — CBC
HCT: 40 % (ref 36.0–46.0)
Hemoglobin: 13.2 g/dL (ref 12.0–15.0)
MCH: 30.6 pg (ref 26.0–34.0)
MCHC: 33 g/dL (ref 30.0–36.0)
MCV: 92.8 fL (ref 80.0–100.0)
Platelets: 206 10*3/uL (ref 150–400)
RBC: 4.31 MIL/uL (ref 3.87–5.11)
RDW: 12.7 % (ref 11.5–15.5)
WBC: 7 10*3/uL (ref 4.0–10.5)
nRBC: 0 % (ref 0.0–0.2)

## 2022-12-25 LAB — BASIC METABOLIC PANEL
Anion gap: 7 (ref 5–15)
BUN: 24 mg/dL — ABNORMAL HIGH (ref 8–23)
CO2: 27 mmol/L (ref 22–32)
Calcium: 9 mg/dL (ref 8.9–10.3)
Chloride: 100 mmol/L (ref 98–111)
Creatinine, Ser: 0.82 mg/dL (ref 0.44–1.00)
GFR, Estimated: >60 mL/min (ref >60)
Glucose, Bld: 112 mg/dL — ABNORMAL HIGH (ref 70–99)
Potassium: 4.8 mmol/L (ref 3.5–5.1)
Sodium: 134 mmol/L — ABNORMAL LOW (ref 135–145)

## 2022-12-25 LAB — LIPID PANEL
Cholesterol: 154 mg/dL (ref 0–200)
HDL: 42 mg/dL (ref >40)
LDL Cholesterol: 101 mg/dL — ABNORMAL HIGH (ref 0–99)
Total CHOL/HDL Ratio: 3.7 RATIO
Triglycerides: 57 mg/dL (ref <150)
VLDL: 11 mg/dL (ref 0–40)

## 2022-12-25 LAB — HEPATIC FUNCTION PANEL
ALT: 11 U/L (ref 0–44)
AST: 15 U/L (ref 15–41)
Albumin: 3.9 g/dL (ref 3.5–5.0)
Alkaline Phosphatase: 70 U/L (ref 38–126)
Bilirubin, Direct: 0.1 mg/dL (ref 0.0–0.2)
Indirect Bilirubin: 0.6 mg/dL (ref 0.3–0.9)
Total Bilirubin: 0.7 mg/dL (ref 0.3–1.2)
Total Protein: 6.6 g/dL (ref 6.5–8.1)

## 2022-12-25 NOTE — Patient Instructions (Signed)
[facility] to get your lab work done.  

## 2022-12-25 NOTE — Progress Notes (Signed)
PCP: Cleon Dew, MD (last seen 03/24) Primary Cardiologist: Julien Nordmann, MD (last seen 05/24)  HPI:  Ms Voth is a 89 y/o female with a history of HTN, anxiety, glaucoma, legally blind, obesity and chronic heart failure.   Echo 08/14/21: EF of 40-45% along with mild LAE and mild MR.   Has not been admitted or been in the ED in the last 6 months.   She presents today for a HF follow-up visit with a chief complaint of tinnitus in right ear. Says that she doesn't notice it all the time but often enough. She feels unbalanced because of this. Has associated minimal fatigue along with this. Denies SOB, chest pain, cough, palpitations, abdominal distention, pedal edema, dizziness, difficulty sleeping or weight gain.   Ate a hot dog over the 4th of July and says that she felt "sick" and her blood pressure went up. BP came down to normal after a few hours.   ROS: All systems negative except as listed in HPI, PMH and Problem List.  SH:  Social History   Socioeconomic History   Marital status: Widowed    Spouse name: Not on file   Number of children: Not on file   Years of education: Not on file   Highest education level: Not on file  Occupational History   Not on file  Tobacco Use   Smoking status: Never   Smokeless tobacco: Never  Vaping Use   Vaping status: Never Used  Substance and Sexual Activity   Alcohol use: Yes    Comment: rare   Drug use: Never   Sexual activity: Not on file  Other Topics Concern   Not on file  Social History Narrative   Lives in [place], [place] w/ son and dtr-in-law.  Sedentary - doesn't leave the house often.   Social Determinants of Health   Financial Resource Strain: Low Risk  (05/29/2021)   Received from [facility]   Overall Financial Resource Strain (CARDIA)    Difficulty of Paying Living Expenses: Not hard at all  Food Insecurity: No Food Insecurity (05/29/2021)   Received from [facility]   Hunger Vital Sign    Worried About  Running Out of Food in the Last Year: Never true    Ran Out of Food in the Last Year: Never true  Transportation Needs: No Transportation Needs (05/29/2021)   Received from [facility] - Transportation    Lack of Transportation (Medical): No    Lack of Transportation (Non-Medical): No  Physical Activity: Insufficiently Active (05/29/2021)   Received from [facility]   Exercise Vital Sign    Days of Exercise per Week: 3 days    Minutes of Exercise per Session: 30 min  Stress: No Stress Concern Present (05/29/2021)   Received from [facility] - Occupational Stress Questionnaire    Feeling of Stress : Only a little  Social Connections: Moderately Integrated (05/29/2021)   Received from [facility]   Social Connection and Isolation Panel [NHANES]    Frequency of Communication with Friends and Family: More than three times a week    Frequency of Social Gatherings with Friends and Family: More than three times a week    Attends Religious Services: More than 4 times per year    Active Member of [employer] or Organizations: Yes    Attends Banker Meetings: More than 4 times per year  Marital Status: Widowed  Intimate Partner Violence: Not At Risk (05/29/2021)   Received from [facility]   Humiliation, Afraid, Rape, and Kick questionnaire    Fear of Current or Ex-Partner: No    Emotionally Abused: No    Physically Abused: No    Sexually Abused: No    FH:  Family History  Problem Relation Age of Onset   Hypertension Father    Heart disease Father     Past Medical History:  Diagnosis Date   Anxiety    Chronic HFmrEF    a. 09/2019 EF 45-50%; b. 08/2021 Echo: EF 40-45%, glob HK, GrII, DD, nl RV fxn, mildly dil LA, mild MR.   Glaucoma    History of stress test    a. Reports normal stress test in [place], [place] ~ 2017.   HTN (hypertension)    Morbid obesity (HCC)     Current Outpatient Medications   Medication Sig Dispense Refill   albuterol (VENTOLIN HFA) 108 (90 Base) MCG/ACT inhaler Inhale 2 puffs into the lungs every 4 (four) hours as needed. (Patient not taking: Reported on 08/29/2021) 18 g 0   carvedilol (COREG) 6.25 MG tablet Take 1 tablet (6.25 mg total) by mouth 2 (two) times daily with a meal. 180 tablet 3   furosemide (LASIX) 20 MG tablet Take 1 tablet (20 mg total) by mouth 2 (two) times daily. 180 tablet 3   latanoprost (XALATAN) 0.005 % ophthalmic solution Place 1 drop into both eyes at bedtime.     sacubitril-valsartan (ENTRESTO) 49-51 MG Take 1 tablet by mouth 2 (two) times daily. 180 tablet 3   Spacer/Aero-Holding Chambers (AEROCHAMBER MV) inhaler Use as instructed (Patient not taking: Reported on 10/23/2021) 1 each 2   spironolactone (ALDACTONE) 25 MG tablet TAKE 1/2 TABLET(12.5 MG) BY MOUTH DAILY 45 tablet 3   timolol (TIMOPTIC) 0.5 % ophthalmic solution Place 1 drop into the left eye daily.     No current facility-administered medications for this visit.   Vitals:   12/25/22 1335  BP: (!) 120/57  Pulse: 62  SpO2: 100%  Weight: 182 lb (82.6 kg)   Wt Readings from Last 3 Encounters:  12/25/22 182 lb (82.6 kg)  10/13/22 183 lb (83 kg)  02/26/22 182 lb 4 oz (82.7 kg)   Lab Results  Component Value Date   CREATININE 0.78 12/25/2021   CREATININE 0.79 08/29/2021   CREATININE 0.65 08/16/2021   PHYSICAL EXAM:  General:  Well appearing. No resp difficulty HEENT: normal Neck: supple. JVP flat. No lymphadenopathy or thryomegaly appreciated. Cor: PMI normal. Regular rate & rhythm. No rubs, gallops or murmurs. Lungs: clear Abdomen: soft, nontender, nondistended. No hepatosplenomegaly. No bruits or masses.  Extremities: no cyanosis, clubbing, rash, edema Neuro: alert & oriented x3, cranial nerves grossly intact. Moves all 4 extremities w/o difficulty. Affect pleasant.   ECG: 10/13/22 with NSR with LBBB   ASSESSMENT & PLAN:  1: Chronic heart failure with mildly  reduced ejection fraction- - suspect due to HTN - NYHA class II - euvolemic today - weighing daily and understands to call for an overnight weight gain of >2 pounds or a weekly weight gain of > 5 pounds - weight stable from last visit here 10 months ago - Echo 08/14/21: EF of 40-45% along with mild LAE and mild MR.  - saw cardiology Mariah Milling) 05/24 - not adding salt although did eat a hot dog over the 4th of July holiday and it caused her to be sick and her  BP to elevate; reviewed the high sodium content of hot dogs and low sodium diet information provided to her daughter today - continue carvedilol 6.25mg  BID - continue entresto 49/51mg  BID - continue spironolactone 12.5mg  daily - continue furosemide 20mg  BID - did not start farxiga as she was concerned about possible side effects and in her 24's, she is feeling well without it - BNP 08/14/21 was 326.8 - will get labs updated: BMP, CBC, LFT and lipid panel ordered today  2: HTN- - BP 120/57 - saw PCP Johnn Hai) 03/24 - BMP 09/02/22 reviewed and showed sodium 142, potassium 4.4, creatinine 0.78 and GFR 72  Return in 6 months, sooner if needed.

## 2022-12-26 ENCOUNTER — Telehealth: Payer: Self-pay

## 2022-12-26 NOTE — Telephone Encounter (Signed)
-----   Message from Nurse Sheryn Bison sent at 12/25/2022  3:41 PM EDT -----  ----- Message ----- From: Delma Freeze, FNP Sent: 12/25/2022   3:35 PM EDT To: [facility] Triage  Potassium and kidney function look good. Liver function tests are normal and so is your CBC so not anemic. Your lipid panel also looks good. Continue to monitor your diet and limit fried/ fatty foods.

## 2022-12-27 ENCOUNTER — Other Ambulatory Visit: Payer: Self-pay | Admitting: Cardiovascular Disease

## 2022-12-30 NOTE — Telephone Encounter (Signed)
-----   Message from Nurse Sheryn Bison sent at 12/25/2022  3:41 PM EDT -----  ----- Message ----- From: Delma Freeze, FNP Sent: 12/25/2022   3:35 PM EDT To: [facility] Triage  Potassium and kidney function look good. Liver function tests are normal and so is your CBC so not anemic. Your lipid panel also looks good. Continue to monitor your diet and limit fried/ fatty foods.

## 2023-06-27 ENCOUNTER — Other Ambulatory Visit: Payer: Self-pay | Admitting: Cardiovascular Disease

## 2023-06-29 ENCOUNTER — Telehealth: Payer: Self-pay | Admitting: Cardiovascular Disease

## 2023-06-29 NOTE — Telephone Encounter (Signed)
Left voice mail

## 2023-06-29 NOTE — Telephone Encounter (Signed)
 Left voice mail, pt needs appt scheduled from recall.

## 2023-06-29 NOTE — Telephone Encounter (Signed)
 Hi,  Will you please outreach patient to schedule past due 6 month  follow up.  Thank you,  Ferne Coe

## 2023-07-03 ENCOUNTER — Encounter: Payer: Medicare Other | Admitting: Family

## 2023-07-06 NOTE — Telephone Encounter (Signed)
Left voice mail

## 2023-07-13 ENCOUNTER — Encounter: Payer: Self-pay | Admitting: Cardiovascular Disease

## 2023-07-13 NOTE — Telephone Encounter (Signed)
Left voicemail, called 3x, unable to reach letter being sent via mail.

## 2023-07-13 NOTE — Telephone Encounter (Signed)
 Left voicemail, unable to reach letter being sent via mail

## 2023-07-17 ENCOUNTER — Telehealth: Payer: Self-pay | Admitting: Family

## 2023-07-17 NOTE — Telephone Encounter (Signed)
 Lvm to r/s cancelled appt on 07/03/23

## 2023-07-21 ENCOUNTER — Encounter: Payer: Self-pay | Admitting: Cardiovascular Disease

## 2023-07-21 DIAGNOSIS — Z79899 Other long term (current) drug therapy: Secondary | ICD-10-CM

## 2023-07-27 ENCOUNTER — Encounter: Payer: Self-pay | Admitting: Family

## 2023-07-27 MED ORDER — SACUBITRIL-VALSARTAN 49-51 MG PO TABS: 1.0000 | ORAL_TABLET | Freq: Two times a day (BID) | ORAL | 1 refills | Status: AC

## 2023-07-29 NOTE — Telephone Encounter (Signed)
Patient currently out of state in [place] visiting family and will not return until this summer.

## 2023-08-12 NOTE — Telephone Encounter (Signed)
 Called patient and left detailed message per DPR. Informed the patient of the following from Dr. Mariah Milling. Asked patient to call back with any questions.   we can provide refill until she is seen in clinic  Ideally needs blood work ASAP when she gets home, BMP  Thx  Tgollan

## 2023-10-09 IMAGING — CR DG CHEST 2V
2 series · 2 of 2 positions shown · non-contrast
Comparison: 08/09/2020

CLINICAL DATA: Cough for 2 months

EXAM:
CHEST - 2 VIEW

[chest pa]
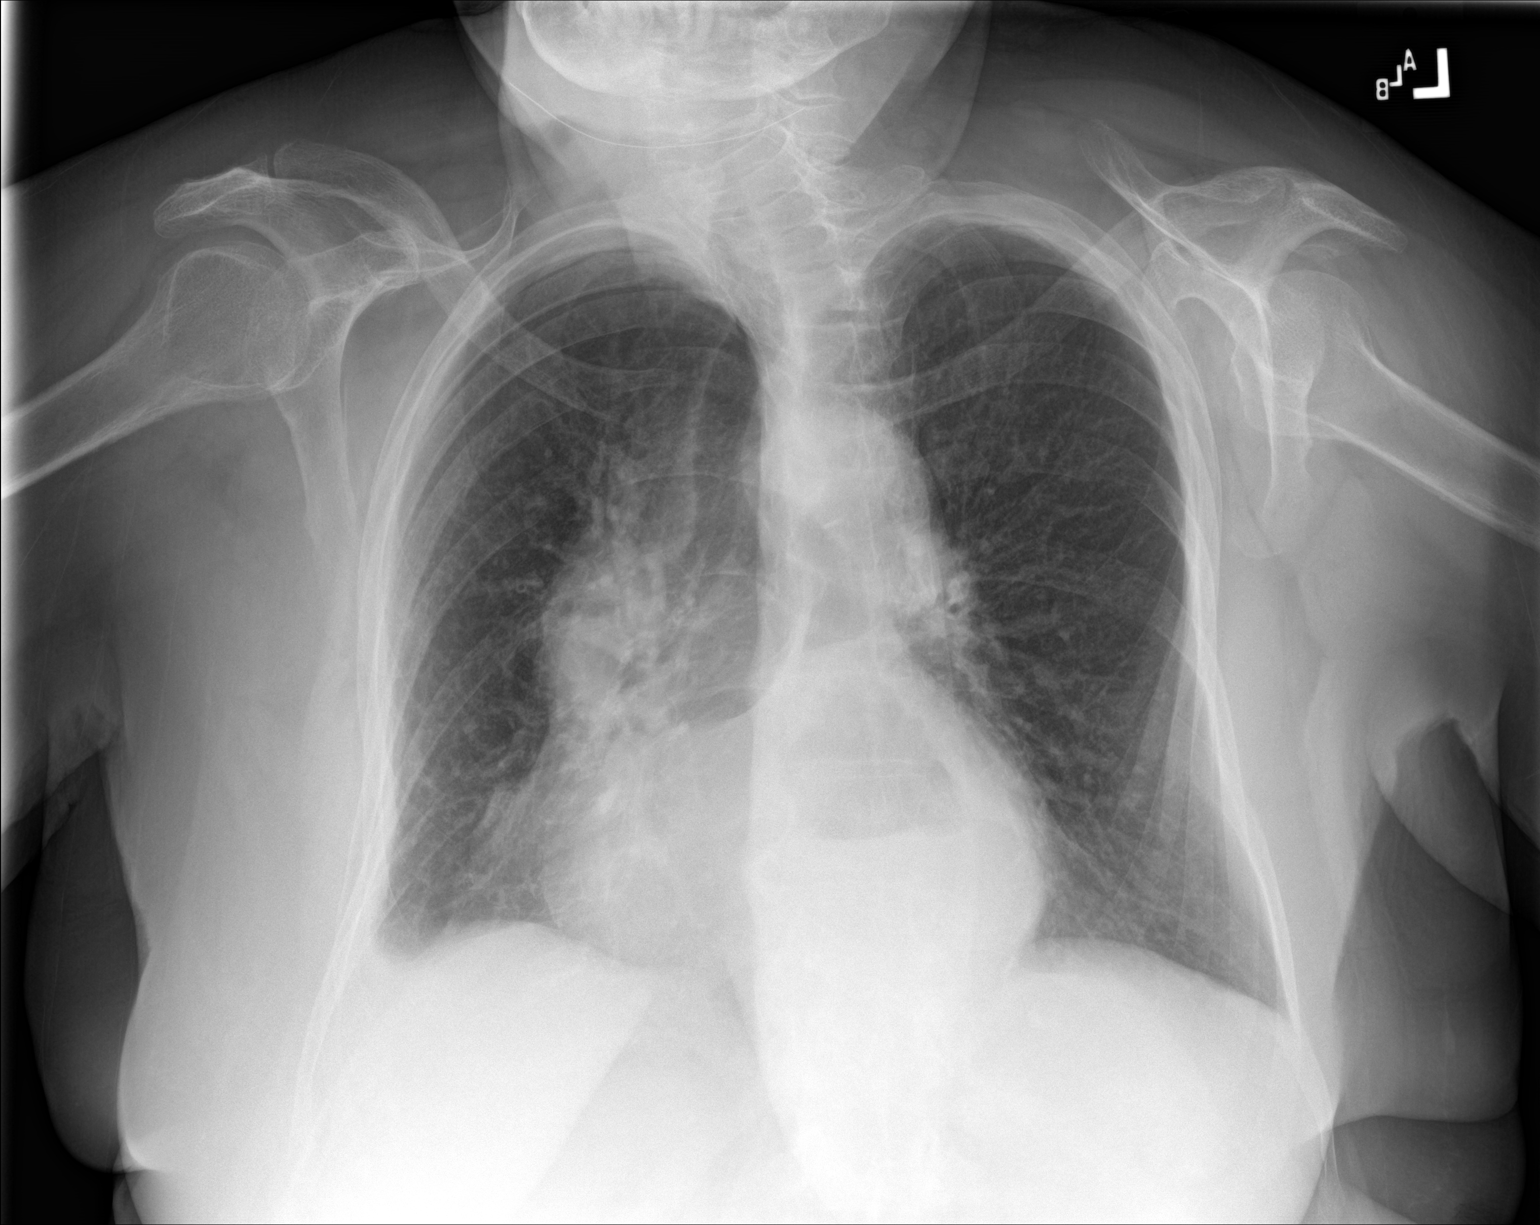

[chest lat]
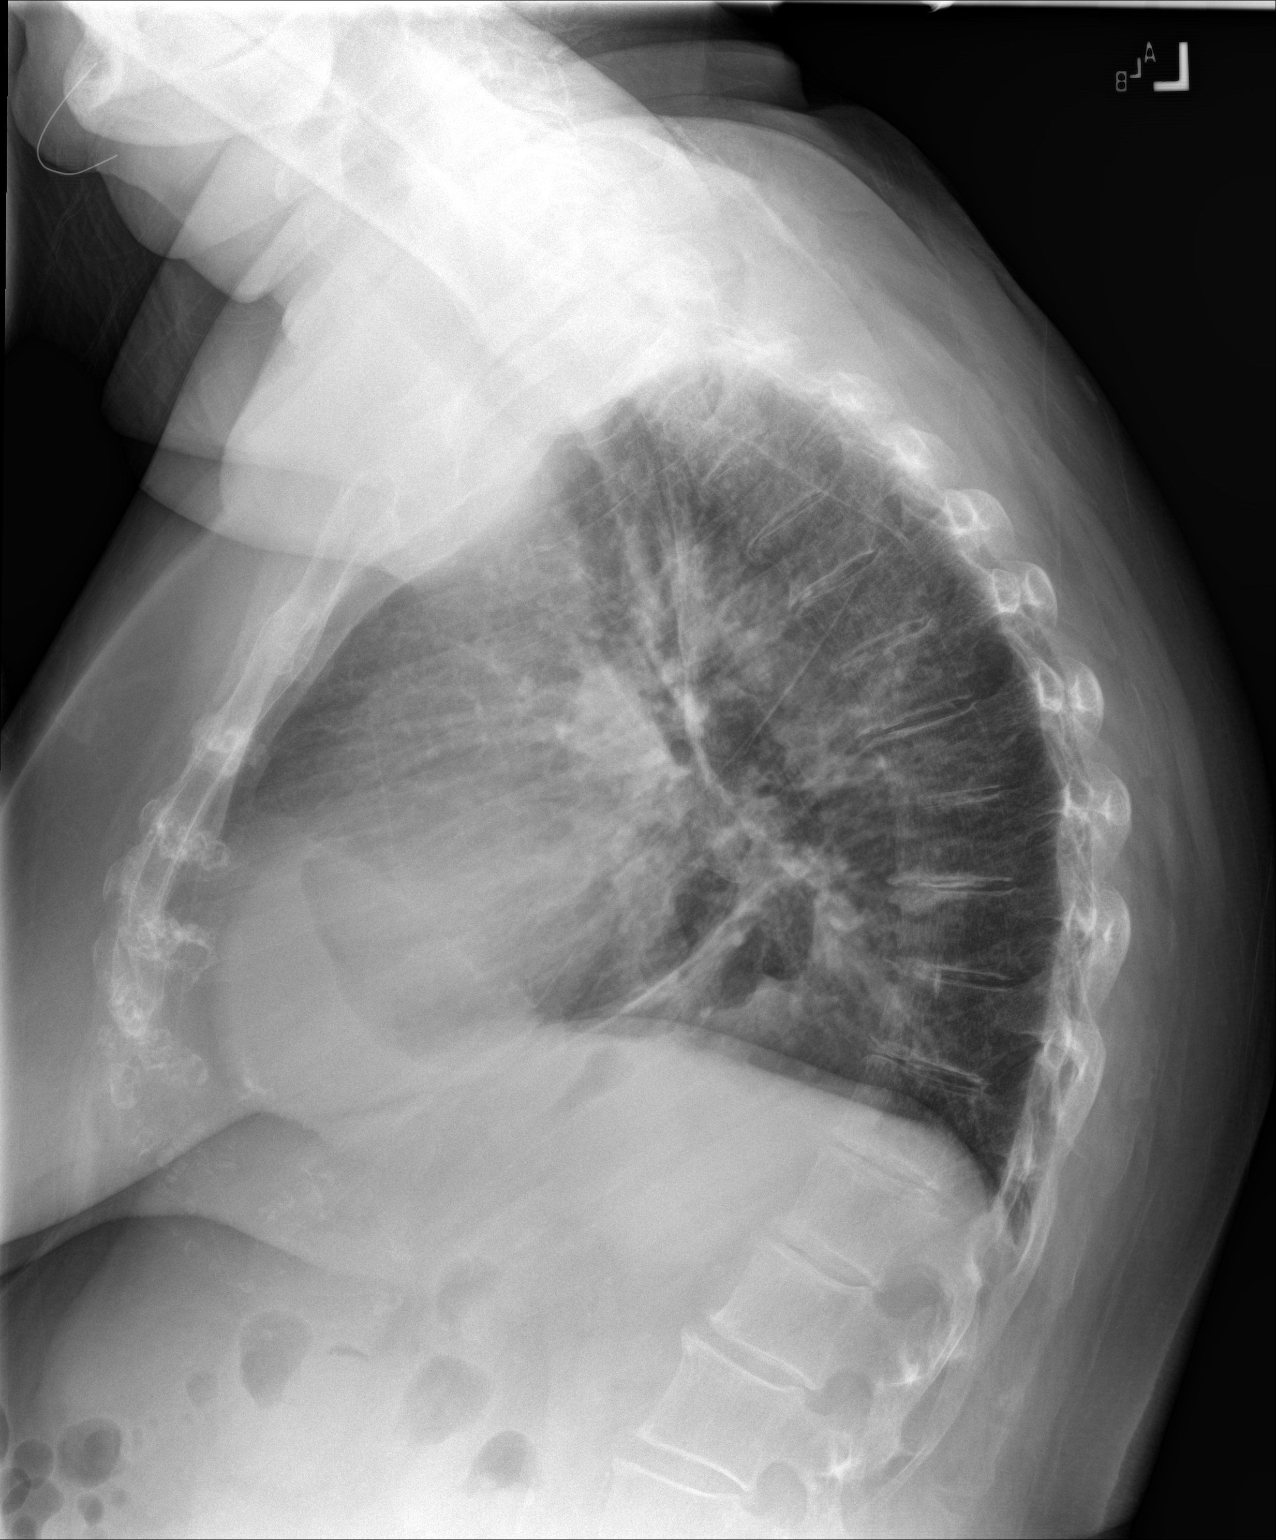

[2 of 2 positions shown; findings below may reference images not displayed]

FINDINGS: Frontal and lateral views of the chest are obtained. Patient is
rotated to the right on the frontal projection. A large hiatal
hernia is again identified. Cardiac silhouette is stable. There is
patchy consolidation in the right lung base, which could reflect
airspace disease or compressive atelectasis. Blunting of the right
lateral costophrenic angle unchanged, consistent with pleural
thickening. No evidence of effusion or pneumothorax.
IMPRESSION: 1. Patchy right basilar consolidation, which may reflect atelectasis
or developing pneumonia.
2. Large hiatal hernia.
3. Stable pleural thickening at the right costophrenic angle.

## 2023-11-30 NOTE — Progress Notes (Deleted)
 Cardiology Office Note  Date:  11/30/2023   ID:  Diana Garrett, DOB 01/28/1930, MRN [id number]  PCP:  Lora Odor, FNP   No chief complaint on file.   HPI:  Ms. Diana Garrett is a 89 year old woman with past medical history of  morbid obesity,  cardiomyopathy,  chronic diastolic/systolic CHF ejection fraction 45%,  hypertension, Who presents for routine follow-up in the office after recent hospitalization for CHF, cardiomyopathy, EF 40 to 45%  Last seen by myself in clinic 5/24 Followed in CHF clinic   hospital August 14, 2021 with shortness of breath/CHF, viral bronchitis  LOV 4/23 Since that time reports she has been doing well with no complaints Over the winter was in [place] , saw cardiology there and was told everything looked good with no significant medication changes  Weight stable at 185 pounds Reports that she takes Lasix  20 mg daily, rarely takes 20 twice daily  Friends and family presenting with her report sometimes has high salt intake, potato chips, she does  Today with little bit of ankle swelling, nothing significant  EKG personally reviewed by myself on todays visit Normal sinus rhythm rate 64 bpm left bundle branch block (new left bundle branch block finding)  Echocardiogram in the hospital ejection fraction 40 to 45%, grade 2 diastolic dysfunction Mild MR  Diagnosed with acute respiratory failure, acute viral bronchitis.,  Also treated with IV diuretics, steroids, nebulizers, Mucinex , albuterolantibiotics  Notes also detailing prior history of anxiety attack History of requiring benzos,  No regular exercise program, walks around the house  PMH:   has a past medical history of Anxiety, Chronic HFmrEF, Glaucoma, History of stress test, HTN (hypertension), and Morbid obesity (HCC).  PSH:    Past Surgical History:  Procedure Laterality Date   CESAREAN SECTION      Current Outpatient Medications  Medication Sig Dispense Refill   albuterol   (VENTOLIN  HFA) 108 (90 Base) MCG/ACT inhaler Inhale 2 puffs into the lungs every 4 (four) hours as needed. (Patient not taking: Reported on 08/29/2021) 18 g 0   carvedilol  (COREG ) 6.25 MG tablet Take 1 tablet (6.25 mg total) by mouth 2 (two) times daily with a meal. 180 tablet 3   furosemide  (LASIX ) 20 MG tablet Take 1 tablet (20 mg total) by mouth 2 (two) times daily. 180 tablet 3   latanoprost  (XALATAN ) 0.005 % ophthalmic solution Place 1 drop into both eyes at bedtime.     sacubitril -valsartan  (ENTRESTO ) 49-51 MG Take 1 tablet by mouth 2 (two) times daily. PLEASE CONTACT OFFICE TO SCHEDULE PAST DUE FOLLOW UP APPOINTMENT BEFORE FURTHER REFILLS ARE REQUESTED [phone] (FIRST ATTEMPT) 180 tablet 1   spironolactone  (ALDACTONE ) 25 MG tablet TAKE 1/2 TABLET(12.5 MG) BY MOUTH DAILY 45 tablet 0   timolol  (TIMOPTIC ) 0.5 % ophthalmic solution Place 1 drop into the left eye daily.     No current facility-administered medications for this visit.     Allergies:   Aspirin, Iodine, Mercury, Shellfish allergy, and Tylenol [acetaminophen]   Social History:  The patient  reports that she has never smoked. She has never used smokeless tobacco. She reports current alcohol use. She reports that she does not use drugs.   Family History:   family history includes Heart disease in her father; Hypertension in her father.    Review of Systems: Review of Systems  Constitutional: Negative.   HENT: Negative.    Respiratory: Negative.    Cardiovascular: Negative.   Gastrointestinal: Negative.   Musculoskeletal: Negative.   Neurological: Negative.  Psychiatric/Behavioral: Negative.    All other systems reviewed and are negative.   PHYSICAL EXAM: VS:  There were no vitals taken for this visit. , BMI There is no height or weight on file to calculate BMI. Constitutional:  oriented to person, place, and time. No distress.  HENT:  Head: Grossly normal Eyes:  no discharge. No scleral icterus.  Neck: No JVD,  no carotid bruits  Cardiovascular: Regular rate and rhythm, no murmurs appreciated Pulmonary/Chest: Clear to auscultation bilaterally, no wheezes or rails Abdominal: Soft.  no distension.  no tenderness.  Musculoskeletal: Normal range of motion Neurological:  normal muscle tone. Coordination normal. No atrophy Skin: Skin warm and dry Psychiatric: normal affect, pleasant  Recent Labs: 12/25/2022: ALT 11; BUN 24; Creatinine, Ser 0.82; Hemoglobin 13.2; Platelets 206; Potassium 4.8; Sodium 134    Lipid Panel Lab Results  Component Value Date   CHOL 154 12/25/2022   HDL 42 12/25/2022   LDLCALC 101 (H) 12/25/2022   TRIG 57 12/25/2022      Wt Readings from Last 3 Encounters:  12/25/22 182 lb (82.6 kg)  10/13/22 183 lb (83 kg)  02/26/22 182 lb 4 oz (82.7 kg)     ASSESSMENT AND PLAN:  Problem List Items Addressed This Visit   None  Acute on chronic respiratory distress Prior hospitalization for viral bronchitis, treated with steroids, antibiotics,  No significant symptoms since that time  Acute on chronic diastolic and systolic CHF Ejection fraction 45% on prior echo Trace lower extremity edema, high salt intake with potato chips, she does Recommend she takes extra Lasix  as needed for ankle swelling abdominal swelling shortness of breath, PND orthopnea, weight gain  Left bundle branch block New finding on EKG Close to left bundle seen on EKG March 2023 Denies angina no symptoms Given age will hold off on ischemic workup  Essential hypertension Blood pressure is well controlled on today's visit. No changes made to the medications.  CHF education discussed on today's visit  Total encounter time more than 40 minutes  Greater than 50% was spent in counseling and coordination of care with the patient    Signed, Velinda Lunger, M.D., Ph.D. [facility] [place], [place] [phone]

## 2023-12-01 ENCOUNTER — Ambulatory Visit: Payer: Medicare Other | Admitting: Cardiovascular Disease

## 2023-12-01 DIAGNOSIS — I420 Dilated cardiomyopathy: Secondary | ICD-10-CM

## 2023-12-01 DIAGNOSIS — J9601 Acute respiratory failure with hypoxia: Secondary | ICD-10-CM

## 2023-12-01 DIAGNOSIS — I5022 Chronic systolic (congestive) heart failure: Secondary | ICD-10-CM

## 2023-12-01 DIAGNOSIS — I10 Essential (primary) hypertension: Secondary | ICD-10-CM

## 2023-12-16 IMAGING — DX DG CHEST 1V PORT
1 series · 1 of 1 positions shown · non-contrast
Comparison: 06/07/2021

CLINICAL DATA: Shortness of breath and cough

EXAM:
PORTABLE CHEST 1 VIEW

[chest ap]
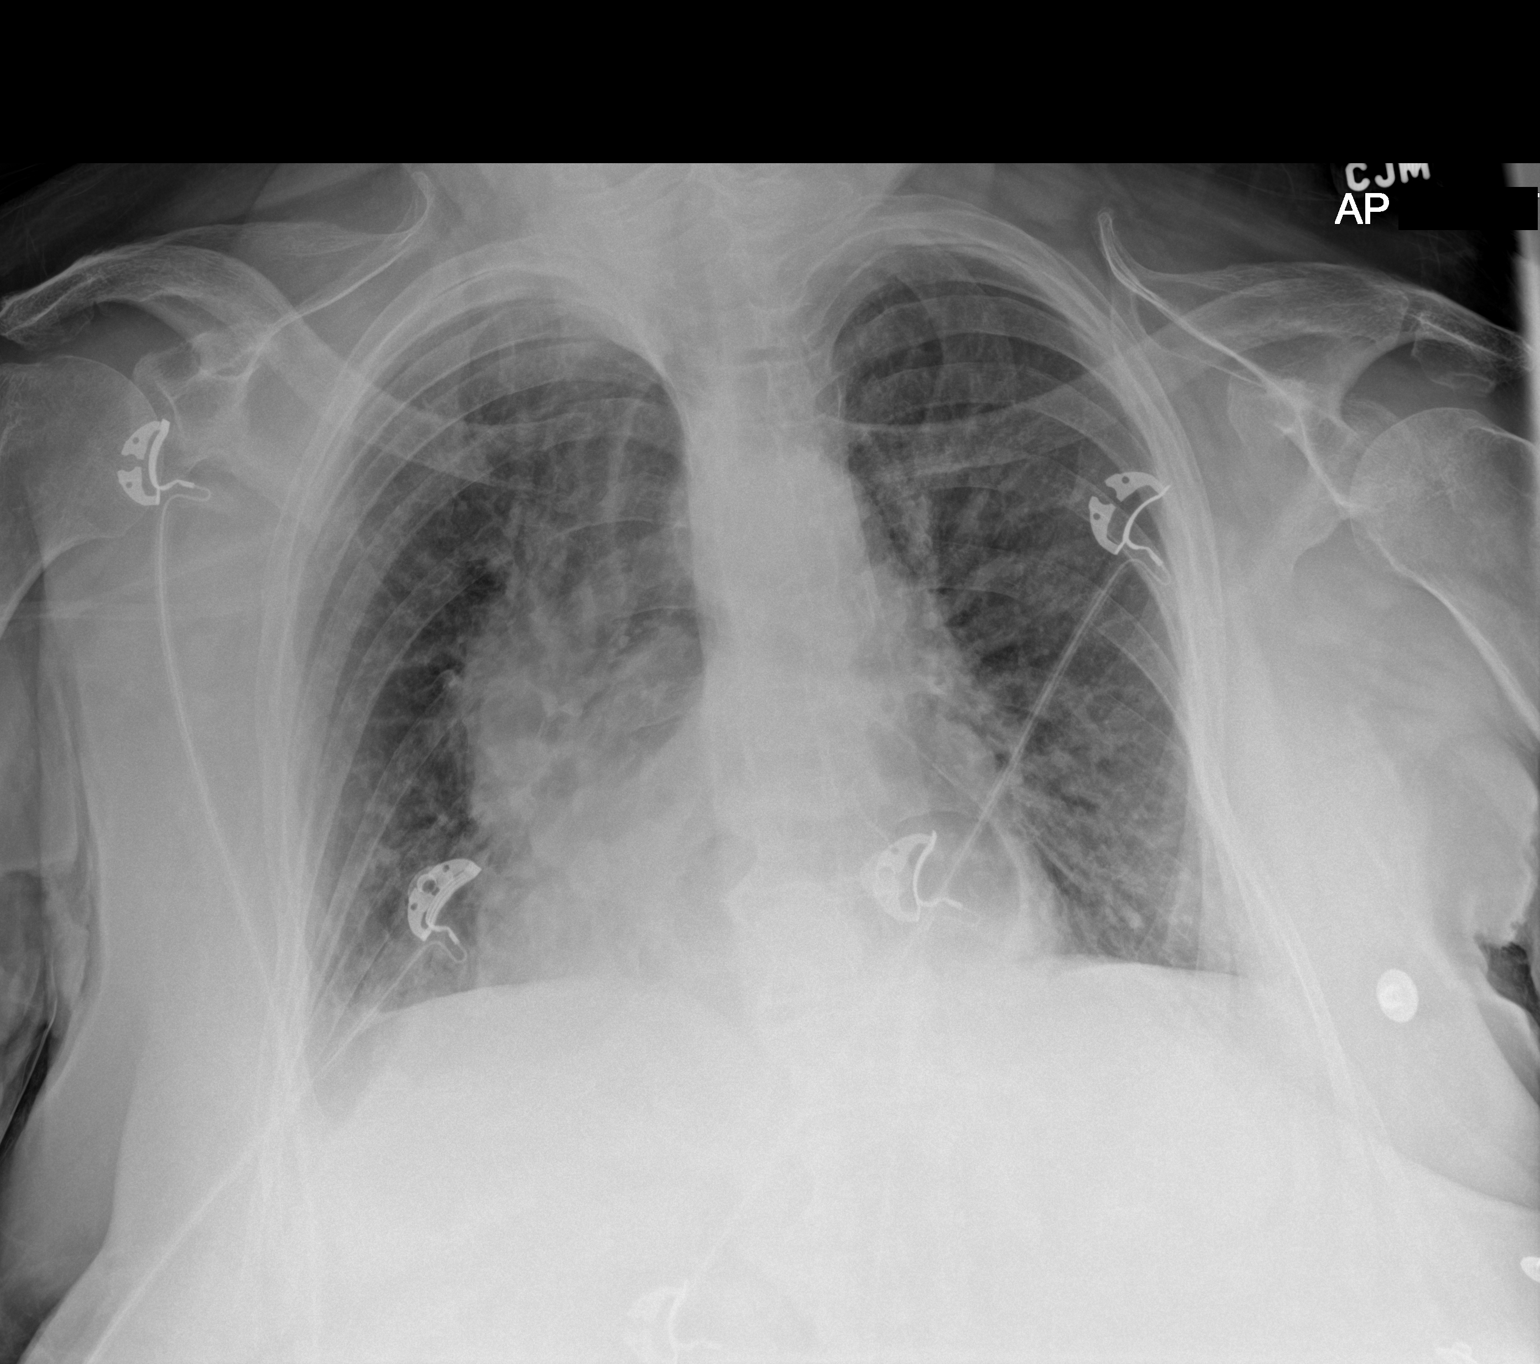

[1 of 1 positions shown; findings below may reference images not displayed]

FINDINGS: Sizable hiatal hernia with similar degree of gas distension.
Borderline heart size with vascular pedicle widening, significantly
limited by rightward rotation. Symmetric generalized interstitial
prominence. Trace pleural effusion is possible. No pneumothorax.
IMPRESSION: Cardiomegaly and mild vascular congestion.
